# Patient Record
Sex: Male | Born: 1975 | Race: White | Hispanic: No | Marital: Single | Smoking: Never smoker
Health system: Southern US, Community
[De-identification: ages and names within clinical notes are randomized; demographics above are authoritative.]

## PROBLEM LIST (undated history)

## (undated) DIAGNOSIS — J45909 Unspecified asthma, uncomplicated: Secondary | ICD-10-CM

## (undated) DIAGNOSIS — I10 Essential (primary) hypertension: Secondary | ICD-10-CM

## (undated) DIAGNOSIS — J9383 Other pneumothorax: Secondary | ICD-10-CM

## (undated) HISTORY — PX: CARDIAC SURGERY: SHX584

## (undated) HISTORY — PX: TONSILLECTOMY: SUR1361

---

## 2013-08-18 ENCOUNTER — Emergency Department: Payer: Self-pay | Admitting: Emergency Medicine

## 2013-08-18 LAB — TROPONIN I: Troponin-I: <0.02 ng/mL

## 2013-08-18 LAB — BASIC METABOLIC PANEL
Anion Gap: 5 — ABNORMAL LOW (ref 7–16)
BUN: 10 mg/dL (ref 7–18)
Chloride: 104 mmol/L (ref 98–107)
Co2: 28 mmol/L (ref 21–32)
Creatinine: 0.87 mg/dL (ref 0.60–1.30)
EGFR (African American): >60
EGFR (Non-African Amer.): >60
Glucose: 99 mg/dL (ref 65–99)

## 2013-08-18 LAB — CBC
HCT: 45.1 % (ref 40.0–52.0)
HGB: 15.6 g/dL (ref 13.0–18.0)
MCHC: 34.7 g/dL (ref 32.0–36.0)
MCV: 79 fL — ABNORMAL LOW (ref 80–100)
Platelet: 284 10*3/uL (ref 150–440)
RBC: 5.68 10*6/uL (ref 4.40–5.90)
WBC: 11.2 10*3/uL — ABNORMAL HIGH (ref 3.8–10.6)

## 2013-08-28 ENCOUNTER — Ambulatory Visit: Payer: Self-pay | Admitting: Cardiology

## 2014-04-16 ENCOUNTER — Ambulatory Visit: Payer: Self-pay | Admitting: Family Medicine

## 2014-05-04 ENCOUNTER — Ambulatory Visit: Payer: Self-pay | Admitting: Family Medicine

## 2015-01-05 IMAGING — CT CT ANGIO CHEST
2 of 6 series · 18 of 36 positions shown · IV contrast (APPLIED)
Comparison: Plain film of the chest earlier this same day.

CLINICAL DATA: Intermittent chest pain for several weeks. Weakness
and dizziness.

EXAM:
CT ANGIOGRAPHY CHEST WITH CONTRAST
TECHNIQUE: Multidetector CT imaging of the chest was performed using the
standard protocol during bolus administration of intravenous
contrast. Multiplanar CT image reconstructions including MIPs were
obtained to evaluate the vascular anatomy.
CONTRAST:  100 cc Isovue 370.

[Series 5: pe 1.0 thins · axial · 0.78mm/px · z∈[-574,-282]mm · 17 of 330 slices shown]
[im 19/330  lung]
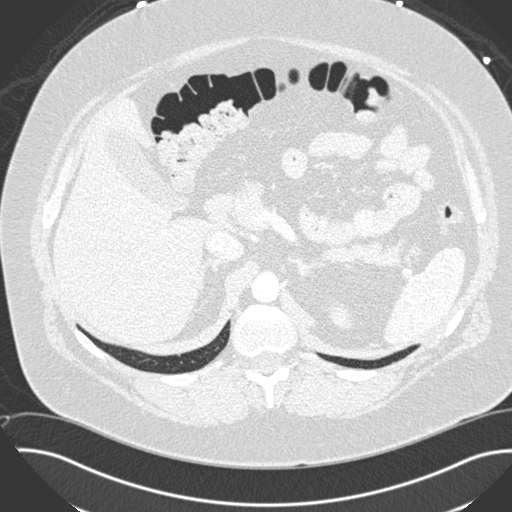
[im 37/330  mediastinal]
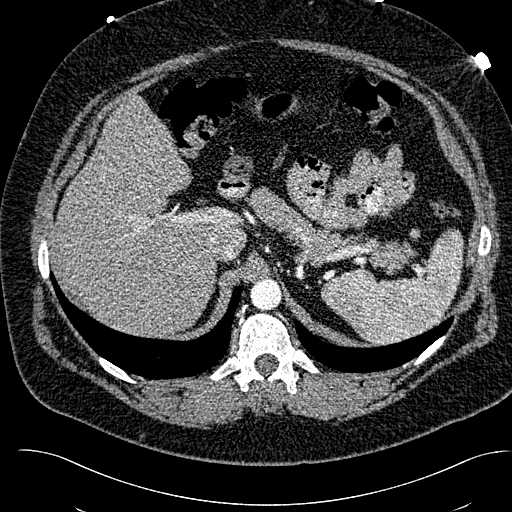
[im 55/330  lung]
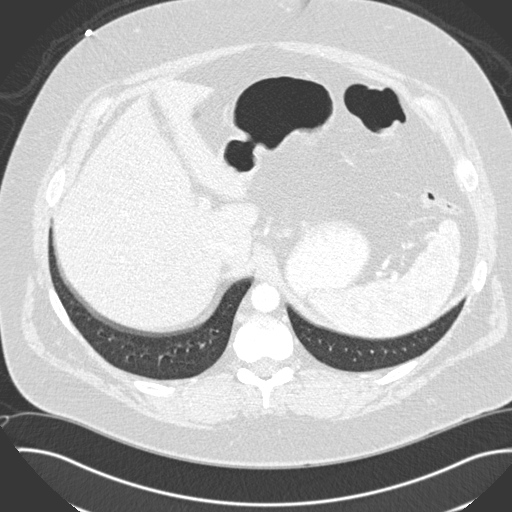
[im 74/330  mediastinal]
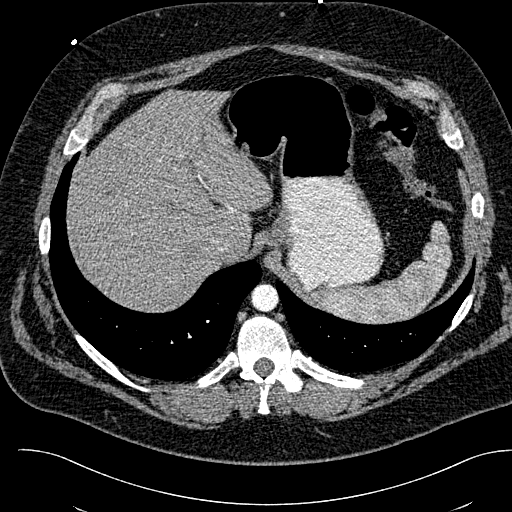
[im 92/330  lung]
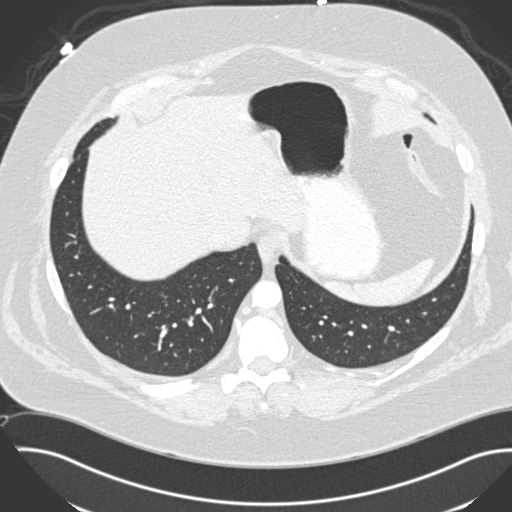
[im 110/330  mediastinal]
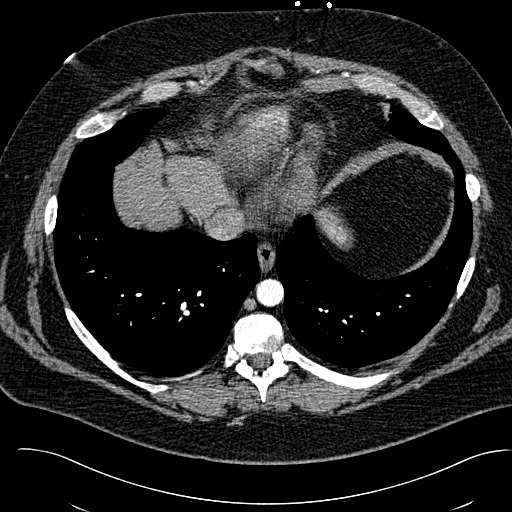
[im 128/330  lung]
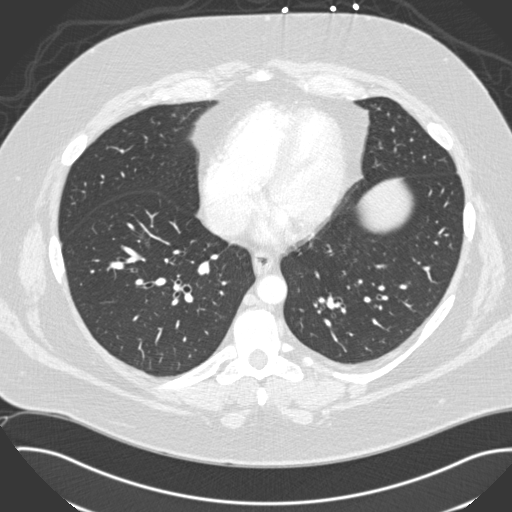
[im 147/330  mediastinal]
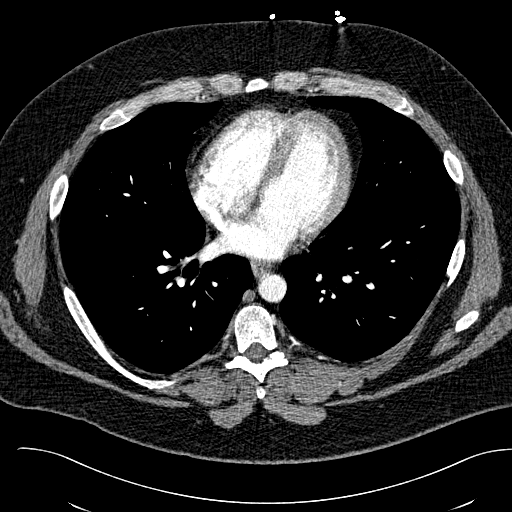
[im 165/330  lung]
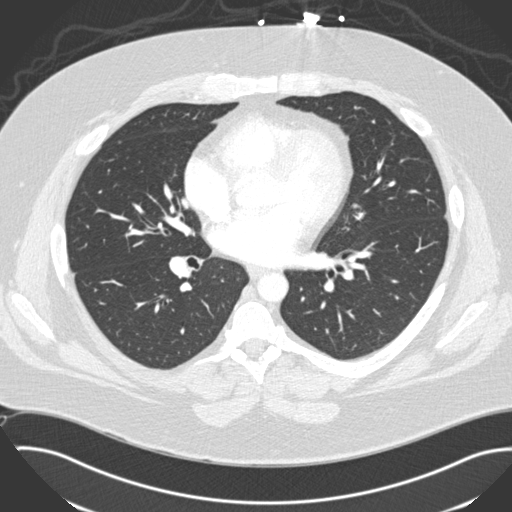
[im 183/330  mediastinal]
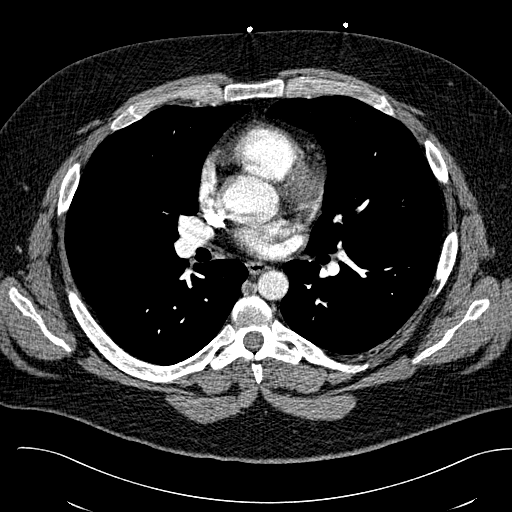
[im 202/330  lung]
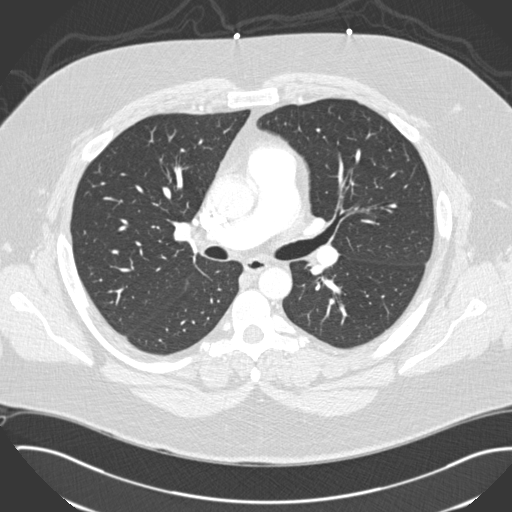
[im 220/330  mediastinal]
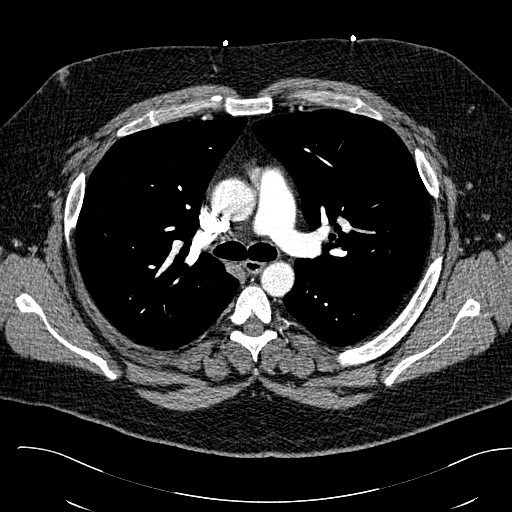
[im 238/330  lung]
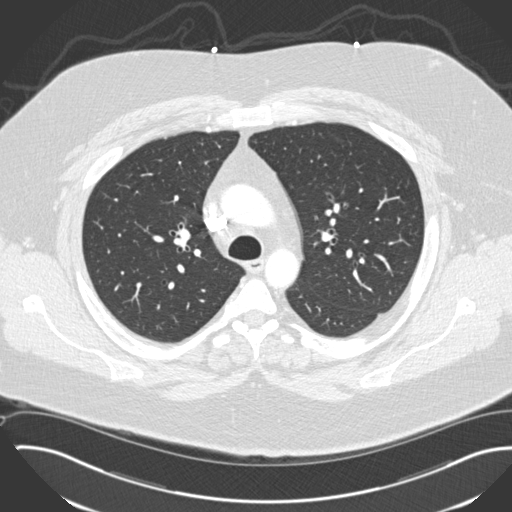
[im 256/330  mediastinal]
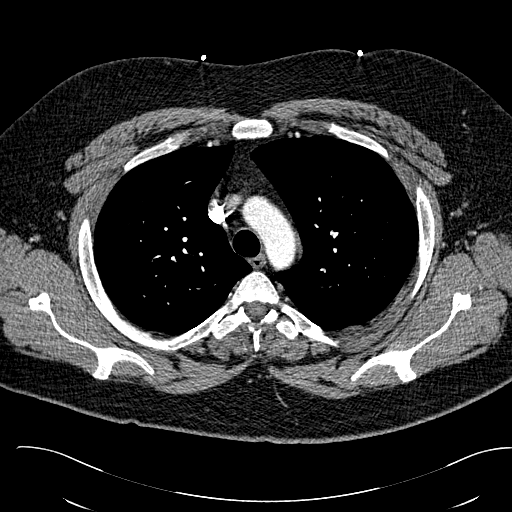
[im 275/330  lung]
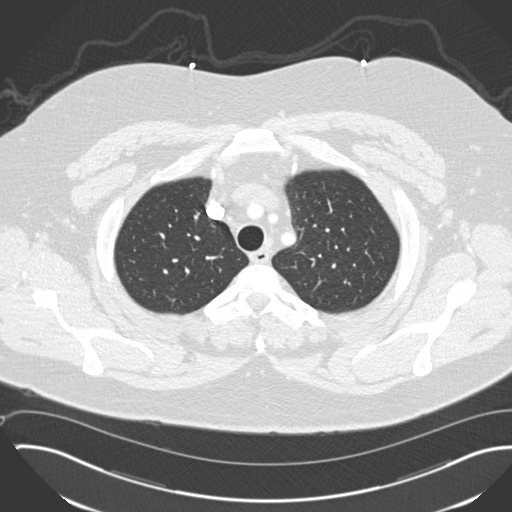
[im 293/330  mediastinal]
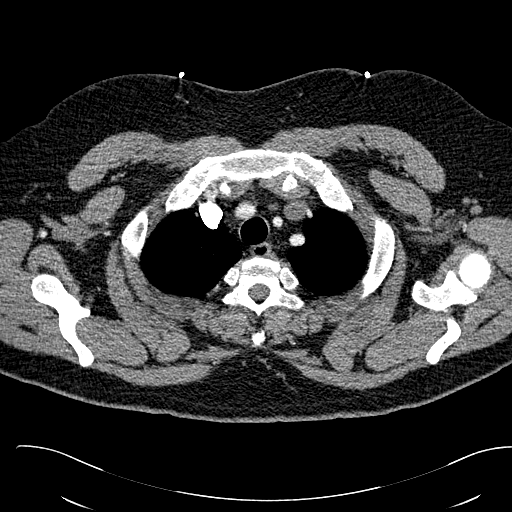
[im 311/330  lung]
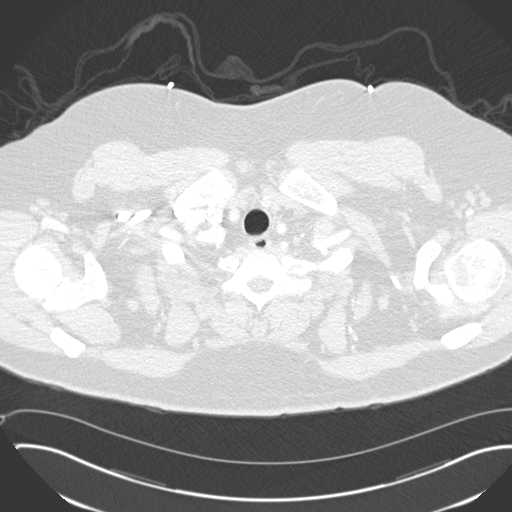

[Series 7: cor pe 2.0 mpr · coronal · 0.75mm/px · 1 of 142 slices shown]
[im 71/142  mediastinal]
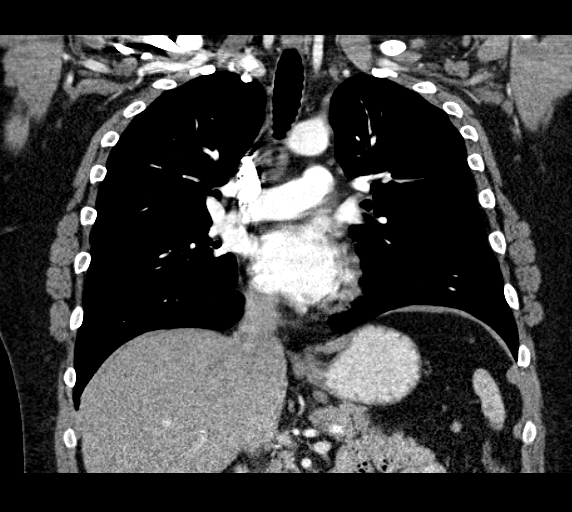

[18 of 36 positions shown; findings below may reference images not displayed]

FINDINGS: No pulmonary embolus is identified. There is no axillary, hilar or
mediastinal lymphadenopathy. No pleural or pericardial effusion.
Heart size is normal. The lungs are clear. No focal abnormality is
seen in the imaged upper abdomen. No focal bony abnormality is
identified.

Review of the MIP images confirms the above findings.
IMPRESSION: Negative for pulmonary embolus.  Negative examination

## 2015-04-12 ENCOUNTER — Ambulatory Visit
Admission: EM | Admit: 2015-04-12 | Discharge: 2015-04-12 | Disposition: A | Discharge status: Home / Self Care | Payer: Self-pay | Attending: Family Medicine | Admitting: Family Medicine

## 2015-04-12 ENCOUNTER — Ambulatory Visit: Payer: Self-pay

## 2015-04-12 DIAGNOSIS — J4541 Moderate persistent asthma with (acute) exacerbation: Secondary | ICD-10-CM | POA: Insufficient documentation

## 2015-04-12 DIAGNOSIS — Z79899 Other long term (current) drug therapy: Secondary | ICD-10-CM | POA: Insufficient documentation

## 2015-04-12 DIAGNOSIS — J209 Acute bronchitis, unspecified: Secondary | ICD-10-CM | POA: Insufficient documentation

## 2015-04-12 DIAGNOSIS — J40 Bronchitis, not specified as acute or chronic: Secondary | ICD-10-CM

## 2015-04-12 HISTORY — DX: Other pneumothorax: J93.83

## 2015-04-12 HISTORY — DX: Unspecified asthma, uncomplicated: J45.909

## 2015-04-12 HISTORY — DX: Essential (primary) hypertension: I10

## 2015-04-12 MED ORDER — IPRATROPIUM-ALBUTEROL 0.5-2.5 (3) MG/3ML IN SOLN
3.0000 mL | Freq: Once | RESPIRATORY_TRACT | Status: AC
  Administered 2015-04-12: 3 mL via RESPIRATORY_TRACT

## 2015-04-12 MED ORDER — METHYLPREDNISOLONE SODIUM SUCC 125 MG IJ SOLR
125.0000 mg | Freq: Once | INTRAMUSCULAR | Status: AC
  Administered 2015-04-12: 125 mg via INTRAMUSCULAR

## 2015-04-12 MED ORDER — HYDROCOD POLST-CPM POLST ER 10-8 MG/5ML PO SUER: 5.0000 mL | Freq: Two times a day (BID) | ORAL | Status: DC | PRN

## 2015-04-12 MED ORDER — PREDNISONE 10 MG (21) PO TBPK: ORAL_TABLET | ORAL | Status: DC

## 2015-04-12 MED ORDER — AZITHROMYCIN 250 MG PO TABS: ORAL_TABLET | ORAL | Status: DC

## 2015-04-12 NOTE — ED Provider Notes (Addendum)
CSN: [id number]     Arrival date & time 04/12/15  1626 History   First MD Initiated Contact with Patient 04/12/15 1650     Chief Complaint  Patient presents with  . Cough   (Consider location/radiation/quality/duration/timing/severity/associated sxs/prior Treatment) Patient is a 39 y.o. male presenting with cough. The history is provided by the patient. No language interpreter was used.  Cough Cough characteristics:  Productive Sputum characteristics:  Green Severity:  Moderate Duration:  2 weeks Timing:  Unable to specify Progression:  Waxing and waning Chronicity:  New Context: upper respiratory infection   Relieved by:  Beta-agonist inhaler Worsened by:  Activity Ineffective treatments:  Beta-agonist inhaler, decongestant and cough suppressants Associated symptoms: no chest pain, no chills and no fever   Risk factors: no chemical exposure, no recent infection and no recent travel     Past Medical History  Diagnosis Date  . Asthma   . Hypertension   . Spontaneous pneumothorax     Age 70   Past Surgical History  Procedure Laterality Date  . Tonsillectomy     Family History  Problem Relation Age of Onset  . Pulmonary embolism Mother   . Deep vein thrombosis Mother   . Aortic aneurysm Father    History  Substance Use Topics  . Smoking status: Never Smoker   . Smokeless tobacco: Not on file  . Alcohol Use: Yes     Comment: socially    Review of Systems  Constitutional: Negative for fever and chills.  Respiratory: Positive for cough.   Cardiovascular: Negative for chest pain.  All other systems reviewed and are negative.   Allergies  Sulfa antibiotics; Augmentin; Ceclor; and Penicillins  Home Medications   Prior to Admission medications   Medication Sig Start Date End Date Taking? Authorizing Provider  albuterol (PROVENTIL HFA;VENTOLIN HFA) 108 (90 BASE) MCG/ACT inhaler Inhale 2 puffs into the lungs every 4 (four) hours as needed for wheezing or shortness  of breath.   Yes Historical Provider, MD  metoprolol (LOPRESSOR) 100 MG tablet Take 100 mg by mouth 2 (two) times daily.   Yes Historical Provider, MD  montelukast (SINGULAIR) 10 MG tablet Take 10 mg by mouth at bedtime.   Yes Historical Provider, MD  quinapril-hydrochlorothiazide (ACCURETIC) 10-12.5 MG per tablet Take 1 tablet by mouth daily.   Yes Historical Provider, MD  azithromycin (ZITHROMAX Z-PAK) 250 MG tablet Take 2 tablets first day and then 1 po a day for 4 days 04/12/15   Hassan Rowan, MD  chlorpheniramine-HYDROcodone [facility] ER) 10-8 MG/5ML SUER Take 5 mLs by mouth every 12 (twelve) hours as needed for cough. 04/12/15   Hassan Rowan, MD  predniSONE (STERAPRED UNI-PAK 21 TAB) 10 MG (21) TBPK tablet Sig 6 tablet day 1, 5 tablets day 2, 4 tablets day 3,,3tablets day 4, 2 tablets day 5, 1 tablet day 6 take all tablets orally Patient request individual pills versus pack 04/12/15   Hassan Rowan, MD   BP 141/87 mmHg  Pulse 94  Temp(Src) 99.5 F (37.5 C) (Tympanic)  Resp 17  Ht 5\' 10"  (1.778 m)  Wt 315 lb (142.883 kg)  BMI 45.20 kg/m2  SpO2 94% Physical Exam  Constitutional: He is oriented to person, place, and time. He appears well-developed and well-nourished. No distress.  HENT:  Head: Normocephalic and atraumatic.  Eyes: Pupils are equal, round, and reactive to light.  Neck: Normal range of motion. Neck supple. No tracheal deviation present. No thyromegaly present.  Cardiovascular: Normal rate and regular  rhythm.   Pulmonary/Chest: He is in respiratory distress. He has wheezes.  Musculoskeletal: Normal range of motion.  Neurological: He is oriented to person, place, and time. No cranial nerve deficit.  Skin: Skin is warm. He is not diaphoretic.  Psychiatric: He has a normal mood and affect.  Vitals reviewed.   ED Course  Procedures (including critical care time) Labs Review Labs Reviewed - No data to display  Imaging Review Dg Chest 2 View  04/12/2015   CLINICAL  DATA:  Cough, wheezing, shortness of breath and chest tightness for 2 weeks worse at night, cough occasionally productive of green fluid. History hypertension, asthma  EXAM: CHEST  2 VIEW  COMPARISON:  08/18/2013  FINDINGS: Normal heart size, mediastinal contours, and pulmonary vascularity.  Minimal chronic central peribronchial thickening.  Lungs clear.  No pneumothorax.  Bones unremarkable.  IMPRESSION: Minimal chronic bronchitic changes without infiltrate.   Electronically Signed   By: Ulyses Southward M.D.   On: 04/12/2015 17:27  Since no signs of pneumonia is present will place on a 6 day course of prednisone w/a Z-pack . Duoneb given with injection of solumedrol.   MDM   1. Bronchitis with bronchospasm   2. Asthma, moderate persistent, with acute exacerbation        Hassan Rowan, MD 04/12/15 1191  Hassan Rowan, MD 04/14/15 1322

## 2015-04-12 NOTE — Discharge Instructions (Signed)
Acute Bronchitis Bronchitis is when the airways that extend from the windpipe into the lungs get red, puffy, and painful (inflamed). Bronchitis often causes thick spit (mucus) to develop. This leads to a cough. A cough is the most common symptom of bronchitis. In acute bronchitis, the condition usually begins suddenly and goes away over time (usually in 2 weeks). Smoking, allergies, and asthma can make bronchitis worse. Repeated episodes of bronchitis may cause more lung problems. HOME CARE  Rest.  Drink enough fluids to keep your pee (urine) clear or pale yellow (unless you need to limit fluids as told by your doctor).  Only take over-the-counter or prescription medicines as told by your doctor.  Avoid smoking and secondhand smoke. These can make bronchitis worse. If you are a smoker, think about using nicotine gum or skin patches. Quitting smoking will help your lungs heal faster.  Reduce the chance of getting bronchitis again by:  Washing your hands often.  Avoiding people with cold symptoms.  Trying not to touch your hands to your mouth, nose, or eyes.  Follow up with your doctor as told. GET HELP IF: Your symptoms do not improve after 1 week of treatment. Symptoms include:  Cough.  Fever.  Coughing up thick spit.  Body aches.  Chest congestion.  Chills.  Shortness of breath.  Sore throat. GET HELP RIGHT AWAY IF:   You have an increased fever.  You have chills.  You have severe shortness of breath.  You have bloody thick spit (sputum).  You throw up (vomit) often.  You lose too much body fluid (dehydration).  You have a severe headache.  You faint. MAKE SURE YOU:   Understand these instructions.  Will watch your condition.  Will get help right away if you are not doing well or get worse. Document Released: 02/13/2008 Document Revised: 04/29/2013 Document Reviewed: 02/17/2013 [facility] Patient Information 2015 [place], [place]. This information is not  intended to replace advice given to you by your health care provider. Make sure you discuss any questions you have with your health care provider.  Asthma Attack Prevention Although there is no way to prevent asthma from starting, you can take steps to control the disease and reduce its symptoms. Learn about your asthma and how to control it. Take an active role to control your asthma by working with your health care provider to create and follow an asthma action plan. An asthma action plan guides you in:  Taking your medicines properly.  Avoiding things that set off your asthma or make your asthma worse (asthma triggers).  Tracking your level of asthma control.  Responding to worsening asthma.  Seeking emergency care when needed. To track your asthma, keep records of your symptoms, check your peak flow number using a handheld device that shows how well air moves out of your lungs (peak flow meter), and get regular asthma checkups.  WHAT ARE SOME WAYS TO PREVENT AN ASTHMA ATTACK?  Take medicines as directed by your health care provider.  Keep track of your asthma symptoms and level of control.  With your health care provider, write a detailed plan for taking medicines and managing an asthma attack. Then be sure to follow your action plan. Asthma is an ongoing condition that needs regular monitoring and treatment.  Identify and avoid asthma triggers. Many outdoor allergens and irritants (such as pollen, mold, cold air, and air pollution) can trigger asthma attacks. Find out what your asthma triggers are and take steps to avoid them.  Monitor your breathing. Learn to recognize warning signs of an attack, such as coughing, wheezing, or shortness of breath. Your lung function may decrease before you notice any signs or symptoms, so regularly measure and record your peak airflow with a home peak flow meter.  Identify and treat attacks early. If you act quickly, you are less likely to have a  severe attack. You will also need less medicine to control your symptoms. When your peak flow measurements decrease and alert you to an upcoming attack, take your medicine as instructed and immediately stop any activity that may have triggered the attack. If your symptoms do not improve, get medical help.  Pay attention to increasing quick-relief inhaler use. If you find yourself relying on your quick-relief inhaler, your asthma is not under control. See your health care provider about adjusting your treatment. WHAT CAN MAKE MY SYMPTOMS WORSE? A number of common things can set off or make your asthma symptoms worse and cause temporary increased inflammation of your airways. Keep track of your asthma symptoms for several weeks, detailing all the environmental and emotional factors that are linked with your asthma. When you have an asthma attack, go back to your asthma diary to see which factor, or combination of factors, might have contributed to it. Once you know what these factors are, you can take steps to control many of them. If you have allergies and asthma, it is important to take asthma prevention steps at home. Minimizing contact with the substance to which you are allergic will help prevent an asthma attack. Some triggers and ways to avoid these triggers are: Animal Dander:  Some people are allergic to the flakes of skin or dried saliva from animals with fur or feathers.   There is no such thing as a hypoallergenic dog or cat breed. All dogs or cats can cause allergies, even if they don't shed.  Keep these pets out of your home.  If you are not able to keep a pet outdoors, keep the pet out of your bedroom and other sleeping areas at all times, and keep the door closed.  Remove carpets and furniture covered with cloth from your home. If that is not possible, keep the pet away from fabric-covered furniture and carpets. Dust Mites: Many people with asthma are allergic to dust mites. Dust mites  are tiny bugs that are found in every home in mattresses, pillows, carpets, fabric-covered furniture, bedcovers, clothes, stuffed toys, and other fabric-covered items.   Cover your mattress in a special dust-proof cover.  Cover your pillow in a special dust-proof cover, or wash the pillow each week in hot water. Water must be hotter than 130 F (54.4 C) to kill dust mites. Cold or warm water used with detergent and bleach can also be effective.  Wash the sheets and blankets on your bed each week in hot water.  Try not to sleep or lie on cloth-covered cushions.  Call ahead when traveling and ask for a smoke-free hotel room. Bring your own bedding and pillows in case the hotel only supplies feather pillows and down comforters, which may contain dust mites and cause asthma symptoms.  Remove carpets from your bedroom and those laid on concrete, if you can.  Keep stuffed toys out of the bed, or wash the toys weekly in hot water or cooler water with detergent and bleach. Cockroaches: Many people with asthma are allergic to the droppings and remains of cockroaches.   Keep food and garbage in closed containers.  Never leave food out.  Use poison baits, traps, powders, gels, or paste (for example, boric acid).  If a spray is used to kill cockroaches, stay out of the room until the odor goes away. Indoor Mold:  Fix leaky faucets, pipes, or other sources of water that have mold around them.  Clean floors and moldy surfaces with a fungicide or diluted bleach.  Avoid using humidifiers, vaporizers, or swamp coolers. These can spread molds through the air. Pollen and Outdoor Mold:  When pollen or mold spore counts are high, try to keep your windows closed.  Stay indoors with windows closed from late morning to afternoon. Pollen and some mold spore counts are highest at that time.  Ask your health care provider whether you need to take anti-inflammatory medicine or increase your dose of the  medicine before your allergy season starts. Other Irritants to Avoid:  Tobacco smoke is an irritant. If you smoke, ask your health care provider how you can quit. Ask family members to quit smoking, too. Do not allow smoking in your home or car.  If possible, do not use a wood-burning stove, kerosene heater, or fireplace. Minimize exposure to all sources of smoke, including incense, candles, fires, and fireworks.  Try to stay away from strong odors and sprays, such as perfume, talcum powder, hair spray, and paints.  Decrease humidity in your home and use an indoor air cleaning device. Reduce indoor humidity to below 60%. Dehumidifiers or central air conditioners can do this.  Decrease house dust exposure by changing furnace and air cooler filters frequently.  Try to have someone else vacuum for you once or twice a week. Stay out of rooms while they are being vacuumed and for a short while afterward.  If you vacuum, use a dust mask from a hardware store, a double-layered or microfilter vacuum cleaner bag, or a vacuum cleaner with a HEPA filter.  Sulfites in foods and beverages can be irritants. Do not drink beer or wine or eat dried fruit, processed potatoes, or shrimp if they cause asthma symptoms.  Cold air can trigger an asthma attack. Cover your nose and mouth with a scarf on cold or windy days.  Several health conditions can make asthma more difficult to manage, including a runny nose, sinus infections, reflux disease, psychological stress, and sleep apnea. Work with your health care provider to manage these conditions.  Avoid close contact with people who have a respiratory infection such as a cold or the flu, since your asthma symptoms may get worse if you catch the infection. Wash your hands thoroughly after touching items that may have been handled by people with a respiratory infection.  Get a flu shot every year to protect against the flu virus, which often makes asthma worse for  days or weeks. Also get a pneumonia shot if you have not previously had one. Unlike the flu shot, the pneumonia shot does not need to be given yearly. Medicines:  Talk to your health care provider about whether it is safe for you to take aspirin or non-steroidal anti-inflammatory medicines (NSAIDs). In a small number of people with asthma, aspirin and NSAIDs can cause asthma attacks. These medicines must be avoided by people who have known aspirin-sensitive asthma. It is important that people with aspirin-sensitive asthma read labels of all over-the-counter medicines used to treat pain, colds, coughs, and fever.  Beta-blockers and ACE inhibitors are other medicines you should discuss with your health care provider. HOW CAN I FIND OUT WHAT  I AM ALLERGIC TO? Ask your asthma health care provider about allergy skin testing or blood testing (the RAST test) to identify the allergens to which you are sensitive. If you are found to have allergies, the most important thing to do is to try to avoid exposure to any allergens that you are sensitive to as much as possible. Other treatments for allergies, such as medicines and allergy shots (immunotherapy) are available.  CAN I EXERCISE? Follow your health care provider's advice regarding asthma treatment before exercising. It is important to maintain a regular exercise program, but vigorous exercise or exercise in cold, humid, or dry environments can cause asthma attacks, especially for those people who have exercise-induced asthma. Document Released: 08/15/2009 Document Revised: 09/01/2013 Document Reviewed: 03/04/2013 [facility] Patient Information 2015 [place], [place]. This information is not intended to replace advice given to you by your health care provider. Make sure you discuss any questions you have with your health care provider.

## 2015-04-12 NOTE — ED Notes (Signed)
Started 2 weeks ago with cough and wheezing. Worse at night. Using Albuteral puffer "more than I should".

## 2015-05-20 ENCOUNTER — Encounter: Payer: Self-pay | Admitting: Emergency Medicine

## 2015-05-20 ENCOUNTER — Ambulatory Visit
Admission: EM | Admit: 2015-05-20 | Discharge: 2015-05-20 | Disposition: A | Discharge status: Home / Self Care | Payer: Self-pay | Attending: Family Medicine | Admitting: Family Medicine

## 2015-05-20 DIAGNOSIS — J45901 Unspecified asthma with (acute) exacerbation: Secondary | ICD-10-CM

## 2015-05-20 MED ORDER — IPRATROPIUM-ALBUTEROL 0.5-2.5 (3) MG/3ML IN SOLN
3.0000 mL | Freq: Once | RESPIRATORY_TRACT | Status: AC
  Administered 2015-05-20: 3 mL via RESPIRATORY_TRACT

## 2015-05-20 MED ORDER — BENZONATATE 100 MG PO CAPS: 100.0000 mg | ORAL_CAPSULE | Freq: Three times a day (TID) | ORAL | Status: DC | PRN

## 2015-05-20 MED ORDER — PREDNISONE 10 MG (21) PO TBPK: 10.0000 mg | ORAL_TABLET | Freq: Every day | ORAL | Status: DC

## 2015-05-20 MED ORDER — DOXYCYCLINE HYCLATE 100 MG PO CAPS: 100.0000 mg | ORAL_CAPSULE | Freq: Two times a day (BID) | ORAL | Status: DC

## 2015-05-20 MED ORDER — FLUTICASONE PROPIONATE 50 MCG/ACT NA SUSP
2.0000 | Freq: Every day | NASAL | Status: DC
Start: 2015-05-20 — End: 2019-09-27

## 2015-05-20 NOTE — ED Notes (Signed)
Patient c/o cough and chest congestion for a week.  Patient denies fevers.  Patient denies any other cold symptoms.

## 2015-05-20 NOTE — Discharge Instructions (Signed)
Contact PCP for follow up asthma- re-establish if those connections have been broken Return to us/ER of choice any acute epsisode Medications as reviewed Add Flonase for seasonal allergies  Weight !!!!!!!!! Healthy food choices and portion control ! Fast Foods are  NOT your friends !!   Asthma, Acute Bronchospasm Acute bronchospasm caused by asthma is also referred to as an asthma attack. Bronchospasm means your air passages become narrowed. The narrowing is caused by inflammation and tightening of the muscles in the air tubes (bronchi) in your lungs. This can make it hard to breathe or cause you to wheeze and cough. CAUSES Possible triggers are:  Animal dander from the skin, hair, or feathers of animals.  Dust mites contained in house dust.  Cockroaches.  Pollen from trees or grass.  Mold.  Cigarette or tobacco smoke.  Air pollutants such as dust, household cleaners, hair sprays, aerosol sprays, paint fumes, strong chemicals, or strong odors.  Cold air or weather changes. Cold air may trigger inflammation. Winds increase molds and pollens in the air.  Strong emotions such as crying or laughing hard.  Stress.  Certain medicines such as aspirin or beta-blockers.  Sulfites in foods and drinks, such as dried fruits and wine.  Infections or inflammatory conditions, such as a flu, cold, or inflammation of the nasal membranes (rhinitis).  Gastroesophageal reflux disease (GERD). GERD is a condition where stomach acid backs up into your esophagus.  Exercise or strenuous activity. SIGNS AND SYMPTOMS   Wheezing.  Excessive coughing, particularly at night.  Chest tightness.  Shortness of breath. DIAGNOSIS  Your health care provider will ask you about your medical history and perform a physical exam. A chest X-ray or blood testing may be performed to look for other causes of your symptoms or other conditions that may have triggered your asthma attack. TREATMENT  Treatment  is aimed at reducing inflammation and opening up the airways in your lungs. Most asthma attacks are treated with inhaled medicines. These include quick relief or rescue medicines (such as bronchodilators) and controller medicines (such as inhaled corticosteroids). These medicines are sometimes given through an inhaler or a nebulizer. Systemic steroid medicine taken by mouth or given through an IV tube also can be used to reduce the inflammation when an attack is moderate or severe. Antibiotic medicines are only used if a bacterial infection is present.  HOME CARE INSTRUCTIONS   Rest.  Drink plenty of liquids. This helps the mucus to remain thin and be easily coughed up. Only use caffeine in moderation and do not use alcohol until you have recovered from your illness.  Do not smoke. Avoid being exposed to secondhand smoke.  You play a critical role in keeping yourself in good health. Avoid exposure to things that cause you to wheeze or to have breathing problems.  Keep your medicines up-to-date and available. Carefully follow your health care provider's treatment plan.  Take your medicine exactly as prescribed.  When pollen or pollution is bad, keep windows closed and use an air conditioner or go to places with air conditioning.  Asthma requires careful medical care. See your health care provider for a follow-up as advised. If you are more than [redacted] weeks pregnant and you were prescribed any new medicines, let your obstetrician know about the visit and how you are doing. Follow up with your health care provider as directed.  After you have recovered from your asthma attack, make an appointment with your outpatient doctor to talk about ways to reduce  the likelihood of future attacks. If you do not have a doctor who manages your asthma, make an appointment with a primary care doctor to discuss your asthma. SEEK IMMEDIATE MEDICAL CARE IF:   You are getting worse.  You have trouble breathing. If  severe, call your local emergency services (911 in the [place]  You develop chest pain or discomfort.  You are vomiting.  You are not able to keep fluids down.  You are coughing up yellow, green, brown, or bloody sputum.  You have a fever and your symptoms suddenly get worse.  You have trouble swallowing. MAKE SURE YOU:   Understand these instructions.  Will watch your condition.  Will get help right away if you are not doing well or get worse. Document Released: 12/12/2006 Document Revised: 09/01/2013 Document Reviewed: 03/04/2013 [facility] Patient Information 2015 [place], [place]. This information is not intended to replace advice given to you by your health care provider. Make sure you discuss any questions you have with your health care provider.  Asthma Attack Prevention Although there is no way to prevent asthma from starting, you can take steps to control the disease and reduce its symptoms. Learn about your asthma and how to control it. Take an active role to control your asthma by working with your health care provider to create and follow an asthma action plan. An asthma action plan guides you in:  Taking your medicines properly.  Avoiding things that set off your asthma or make your asthma worse (asthma triggers).  Tracking your level of asthma control.  Responding to worsening asthma.  Seeking emergency care when needed. To track your asthma, keep records of your symptoms, check your peak flow number using a handheld device that shows how well air moves out of your lungs (peak flow meter), and get regular asthma checkups.  WHAT ARE SOME WAYS TO PREVENT AN ASTHMA ATTACK?  Take medicines as directed by your health care provider.  Keep track of your asthma symptoms and level of control.  With your health care provider, write a detailed plan for taking medicines and managing an asthma attack. Then be sure to follow your action plan. Asthma is an ongoing condition that  needs regular monitoring and treatment.  Identify and avoid asthma triggers. Many outdoor allergens and irritants (such as pollen, mold, cold air, and air pollution) can trigger asthma attacks. Find out what your asthma triggers are and take steps to avoid them.  Monitor your breathing. Learn to recognize warning signs of an attack, such as coughing, wheezing, or shortness of breath. Your lung function may decrease before you notice any signs or symptoms, so regularly measure and record your peak airflow with a home peak flow meter.  Identify and treat attacks early. If you act quickly, you are less likely to have a severe attack. You will also need less medicine to control your symptoms. When your peak flow measurements decrease and alert you to an upcoming attack, take your medicine as instructed and immediately stop any activity that may have triggered the attack. If your symptoms do not improve, get medical help.  Pay attention to increasing quick-relief inhaler use. If you find yourself relying on your quick-relief inhaler, your asthma is not under control. See your health care provider about adjusting your treatment. WHAT CAN MAKE MY SYMPTOMS WORSE? A number of common things can set off or make your asthma symptoms worse and cause temporary increased inflammation of your airways. Keep track of your asthma symptoms for  several weeks, detailing all the environmental and emotional factors that are linked with your asthma. When you have an asthma attack, go back to your asthma diary to see which factor, or combination of factors, might have contributed to it. Once you know what these factors are, you can take steps to control many of them. If you have allergies and asthma, it is important to take asthma prevention steps at home. Minimizing contact with the substance to which you are allergic will help prevent an asthma attack. Some triggers and ways to avoid these triggers are: Animal Dander:  Some  people are allergic to the flakes of skin or dried saliva from animals with fur or feathers.   There is no such thing as a hypoallergenic dog or cat breed. All dogs or cats can cause allergies, even if they don't shed.  Keep these pets out of your home.  If you are not able to keep a pet outdoors, keep the pet out of your bedroom and other sleeping areas at all times, and keep the door closed.  Remove carpets and furniture covered with cloth from your home. If that is not possible, keep the pet away from fabric-covered furniture and carpets. Dust Mites: Many people with asthma are allergic to dust mites. Dust mites are tiny bugs that are found in every home in mattresses, pillows, carpets, fabric-covered furniture, bedcovers, clothes, stuffed toys, and other fabric-covered items.   Cover your mattress in a special dust-proof cover.  Cover your pillow in a special dust-proof cover, or wash the pillow each week in hot water. Water must be hotter than 130 F (54.4 C) to kill dust mites. Cold or warm water used with detergent and bleach can also be effective.  Wash the sheets and blankets on your bed each week in hot water.  Try not to sleep or lie on cloth-covered cushions.  Call ahead when traveling and ask for a smoke-free hotel room. Bring your own bedding and pillows in case the hotel only supplies feather pillows and down comforters, which may contain dust mites and cause asthma symptoms.  Remove carpets from your bedroom and those laid on concrete, if you can.  Keep stuffed toys out of the bed, or wash the toys weekly in hot water or cooler water with detergent and bleach. Cockroaches: Many people with asthma are allergic to the droppings and remains of cockroaches.   Keep food and garbage in closed containers. Never leave food out.  Use poison baits, traps, powders, gels, or paste (for example, boric acid).  If a spray is used to kill cockroaches, stay out of the room until the  odor goes away. Indoor Mold:  Fix leaky faucets, pipes, or other sources of water that have mold around them.  Clean floors and moldy surfaces with a fungicide or diluted bleach.  Avoid using humidifiers, vaporizers, or swamp coolers. These can spread molds through the air. Pollen and Outdoor Mold:  When pollen or mold spore counts are high, try to keep your windows closed.  Stay indoors with windows closed from late morning to afternoon. Pollen and some mold spore counts are highest at that time.  Ask your health care provider whether you need to take anti-inflammatory medicine or increase your dose of the medicine before your allergy season starts. Other Irritants to Avoid:  Tobacco smoke is an irritant. If you smoke, ask your health care provider how you can quit. Ask family members to quit smoking, too. Do not allow smoking  in your home or car.  If possible, do not use a wood-burning stove, kerosene heater, or fireplace. Minimize exposure to all sources of smoke, including incense, candles, fires, and fireworks.  Try to stay away from strong odors and sprays, such as perfume, talcum powder, hair spray, and paints.  Decrease humidity in your home and use an indoor air cleaning device. Reduce indoor humidity to below 60%. Dehumidifiers or central air conditioners can do this.  Decrease house dust exposure by changing furnace and air cooler filters frequently.  Try to have someone else vacuum for you once or twice a week. Stay out of rooms while they are being vacuumed and for a short while afterward.  If you vacuum, use a dust mask from a hardware store, a double-layered or microfilter vacuum cleaner bag, or a vacuum cleaner with a HEPA filter.  Sulfites in foods and beverages can be irritants. Do not drink beer or wine or eat dried fruit, processed potatoes, or shrimp if they cause asthma symptoms.  Cold air can trigger an asthma attack. Cover your nose and mouth with a scarf on  cold or windy days.  Several health conditions can make asthma more difficult to manage, including a runny nose, sinus infections, reflux disease, psychological stress, and sleep apnea. Work with your health care provider to manage these conditions.  Avoid close contact with people who have a respiratory infection such as a cold or the flu, since your asthma symptoms may get worse if you catch the infection. Wash your hands thoroughly after touching items that may have been handled by people with a respiratory infection.  Get a flu shot every year to protect against the flu virus, which often makes asthma worse for days or weeks. Also get a pneumonia shot if you have not previously had one. Unlike the flu shot, the pneumonia shot does not need to be given yearly. Medicines:  Talk to your health care provider about whether it is safe for you to take aspirin or non-steroidal anti-inflammatory medicines (NSAIDs). In a small number of people with asthma, aspirin and NSAIDs can cause asthma attacks. These medicines must be avoided by people who have known aspirin-sensitive asthma. It is important that people with aspirin-sensitive asthma read labels of all over-the-counter medicines used to treat pain, colds, coughs, and fever.  Beta-blockers and ACE inhibitors are other medicines you should discuss with your health care provider. HOW CAN I FIND OUT WHAT I AM ALLERGIC TO? Ask your asthma health care provider about allergy skin testing or blood testing (the RAST test) to identify the allergens to which you are sensitive. If you are found to have allergies, the most important thing to do is to try to avoid exposure to any allergens that you are sensitive to as much as possible. Other treatments for allergies, such as medicines and allergy shots (immunotherapy) are available.  CAN I EXERCISE? Follow your health care provider's advice regarding asthma treatment before exercising. It is important to maintain a  regular exercise program, but vigorous exercise or exercise in cold, humid, or dry environments can cause asthma attacks, especially for those people who have exercise-induced asthma. Document Released: 08/15/2009 Document Revised: 09/01/2013 Document Reviewed: 03/04/2013 [facility] Patient Information 2015 [place], [place]. This information is not intended to replace advice given to you by your health care provider. Make sure you discuss any questions you have with your health care provider.

## 2015-05-20 NOTE — ED Provider Notes (Signed)
CSN: [id number]     Arrival date & time 05/20/15  0751 History   First MD Initiated Contact with Patient 05/20/15 [id number]     Chief Complaint  Patient presents with  . Cough   (Consider location/radiation/quality/duration/timing/severity/associated sxs/prior Treatment) HPI  39 yo M with longstanding asthma having more trouble keeping it controlled. Now using his albuterol inhaler almost hourly on bad days. Takes Singulair 10 mg daily and Zyrtec daily. Has Flonase but doesn't use it. Experiencing wheezing on a daily basis.  Closer questioning reveals his weight has now increased to 361 and he probably hasn't seen who he considers his PCP Dr Daniel Nones in almost 5 years. Has HTN treated with Accuretic and Metoprolol. Was seen here as recently as 04/12/15 with Chronic Bronchitis and treated with Prednisone taper and Zpak. CXR at that time supports chronic bronchitis. Right lower rib cage now with tenderness between ribs suspected from cough  Past Medical History  Diagnosis Date  . Asthma   . Hypertension   . Spontaneous pneumothorax     Age 46   Past Surgical History  Procedure Laterality Date  . Tonsillectomy    . Cardiac surgery     Family History  Problem Relation Age of Onset  . Pulmonary embolism Mother   . Deep vein thrombosis Mother   . Aortic aneurysm Father    Social History  Substance Use Topics  . Smoking status: Never Smoker   . Smokeless tobacco: None  . Alcohol Use: Yes     Comment: socially    Review of Systems  Constitutional -afebrile Eyes-denies visual changes ENT- normal voice,denies sore throat CV-denies chest pain, Resp- respirations increased, wheezing, difficult to sleep at night.; occ sputum AM GI- negative for nausea,vomiting, diarrhea GU- negative for dysuria MSK- negative for back pain, ambulatory Skin- denies acute changes Neuro- negative headache,focal weakness or numbness    Allergies  Sulfa antibiotics; Augmentin; Ceclor; and  Penicillins  Home Medications   Prior to Admission medications   Medication Sig Start Date End Date Taking? Authorizing Provider  albuterol (PROVENTIL HFA;VENTOLIN HFA) 108 (90 BASE) MCG/ACT inhaler Inhale 2 puffs into the lungs every 4 (four) hours as needed for wheezing or shortness of breath.   Yes Historical Provider, MD  cetirizine (ZYRTEC) 10 MG tablet Take 10 mg by mouth at bedtime.   Yes Historical Provider, MD  chlorpheniramine-HYDROcodone (TUSSIONEX PENNKINETIC ER) 10-8 MG/5ML SUER Take 5 mLs by mouth every 12 (twelve) hours as needed for cough. 04/12/15  Yes Hassan Rowan, MD  azithromycin (ZITHROMAX Z-PAK) 250 MG tablet Take 2 tablets first day and then 1 po a day for 4 days 04/12/15   Hassan Rowan, MD  benzonatate (TESSALON) 100 MG capsule Take 1 capsule (100 mg total) by mouth 3 (three) times daily as needed. 05/20/15   Rae Halsted, PA-C  doxycycline (VIBRAMYCIN) 100 MG capsule Take 1 capsule (100 mg total) by mouth 2 (two) times daily. 05/20/15   Rae Halsted, PA-C  fluticasone [facility]) 50 MCG/ACT nasal spray Place 2 sprays into both nostrils daily. 05/20/15   Rae Halsted, PA-C  metoprolol (LOPRESSOR) 100 MG tablet Take 100 mg by mouth 2 (two) times daily.    Historical Provider, MD  montelukast (SINGULAIR) 10 MG tablet Take 10 mg by mouth at bedtime.    Historical Provider, MD  predniSONE (STERAPRED UNI-PAK 21 TAB) 10 MG (21) TBPK tablet Take 1 tablet (10 mg total) by mouth daily. 6-5-4-3-2-1- tablets  Days 1-2-3-4-5 as previously 05/20/15  Rae Halsted, PA-C  quinapril-hydrochlorothiazide (ACCURETIC) 10-12.5 MG per tablet Take 1 tablet by mouth daily.    Historical Provider, MD   Meds Ordered and Administered this Visit   Medications  ipratropium-albuterol (DUONEB) 0.5-2.5 (3) MG/3ML nebulizer solution 3 mL (3 mLs Nebulization Given 05/20/15 0825)  ipratropium-albuterol (DUONEB) 0.5-2.5 (3) MG/3ML nebulizer solution 3 mL (3 mLs Nebulization Given 05/20/15 0858)  Sp02 presented and remained  at 96% but patient  felt much improved with additional Rx  BP 146/77 mmHg  Pulse 92  Temp(Src) 99.3 F (37.4 C) (Tympanic)  Resp 17  Ht 5\' 11"  (1.803 m)  Wt 311 lb (141.069 kg)  BMI 43.40 kg/m2  SpO2 96% No data found.   Physical Exam   Constitutional -alert and oriented,grossly obese, very pleasant adult M Head-atraumatic, normocephalic Eyes- conjunctiva normal, EOMI ,conjugate gaze Nose- no congestion or rhinorrhea Mouth/throat- mucous membranes moist ,oropharynx non-erythematous Neck- supple without glandular enlargement, bull necked CV- regular rate, grossly normal heart sounds,  BP 146/77 Resp-wheezing bilateral everywhere,  and intermittent cough, non productive, resp 17 GI- soft,non-tender,no distention GU-  not examined MSK- no tender, normal ROM, all extremities, ambulatory, self-care; no pedal edema Neuro- normal speech and language, no gross focal neurological deficit appreciated, no gait instability, Skin-warm,dry ,intact; no rash noted Psych-mood and affect grossly  ED Course  Procedures (including critical care time)  Labs Review Labs Reviewed - No data to display  Imaging Review No results found.  Have discussed asthma out of control - and weight equally so.  He loves fast food and commonly consumes cheese, bacon and mayo on multiple burgers, plus fries and a shake Parents and brother have been "on his case" to seek medical evaluation and get a handle on his diet.  Will add back Flonase and use BID for a few days; increase Zyrtec to BID also for 3-5 days.  Add Doxycycline and a short prednisone taper. To go directly to PCP office and determine his current status -if considered active he is to set up ASAP appointment.       MDM   1. Asthma exacerbation    Diagnosis and treatment discussed. . Questions fielded, expectations and recommendations reviewed.  Patient expresses understanding. Will return to [facility] with questions, concern or exacerbation.     Discharge Medication List as of 05/20/2015  9:26 AM    START taking these medications   Details  benzonatate (TESSALON) 100 MG capsule Take 1 capsule (100 mg total) by mouth 3 (three) times daily as needed., Starting 05/20/2015, Until Discontinued, Print    doxycycline (VIBRAMYCIN) 100 MG capsule Take 1 capsule (100 mg total) by mouth 2 (two) times daily., Starting 05/20/2015, Until Discontinued, Print    fluticasone (FLONASE) 50 MCG/ACT nasal spray Place 2 sprays into both nostrils daily., Starting 05/20/2015, Until Discontinued, Print      Sreipred Uni-Pak 21 tabls 10 mg  Rae Halsted, [place] 05/20/15 1512

## 2016-10-01 ENCOUNTER — Ambulatory Visit
Admission: EM | Admit: 2016-10-01 | Discharge: 2016-10-01 | Disposition: A | Discharge status: Home / Self Care | Payer: Self-pay | Attending: Family Medicine | Admitting: Family Medicine

## 2016-10-01 ENCOUNTER — Encounter: Payer: Self-pay | Admitting: *Deleted

## 2016-10-01 DIAGNOSIS — H109 Unspecified conjunctivitis: Secondary | ICD-10-CM

## 2016-10-01 DIAGNOSIS — J111 Influenza due to unidentified influenza virus with other respiratory manifestations: Secondary | ICD-10-CM

## 2016-10-01 DIAGNOSIS — R69 Illness, unspecified: Secondary | ICD-10-CM

## 2016-10-01 LAB — RAPID STREP SCREEN (MED CTR MEBANE ONLY): STREPTOCOCCUS, GROUP A SCREEN (DIRECT): NEGATIVE

## 2016-10-01 LAB — RAPID INFLUENZA A&B ANTIGENS
Influenza A (ARMC): NEGATIVE
Influenza B (ARMC): NEGATIVE

## 2016-10-01 MED ORDER — OSELTAMIVIR PHOSPHATE 75 MG PO CAPS: 75.0000 mg | ORAL_CAPSULE | Freq: Two times a day (BID) | ORAL | 0 refills | Status: DC

## 2016-10-01 MED ORDER — ERYTHROMYCIN 5 MG/GM OP OINT: 1.0000 "application " | TOPICAL_OINTMENT | Freq: Four times a day (QID) | OPHTHALMIC | 0 refills | Status: DC

## 2016-10-01 MED ORDER — ALBUTEROL SULFATE HFA 108 (90 BASE) MCG/ACT IN AERS: 2.0000 | INHALATION_SPRAY | RESPIRATORY_TRACT | 0 refills | Status: DC | PRN

## 2016-10-01 NOTE — Discharge Instructions (Signed)
Take medication as prescribed. Rest. Drink plenty of fluids.  ° °Follow up with your primary care physician this week as needed. Return to Urgent care for new or worsening concerns.  ° °

## 2016-10-01 NOTE — ED Triage Notes (Addendum)
Last Friday onset of productive cough- green, sore throat, fever up to 102, chills, body aches, which partially resolved over weekend. This morning awoke with both eyes matted closed. Eyes red and left eye slightly edematous. Pt's father recently died with Luis Wallace -Fisher syndrome and Guilliand-Barre', which began with conjunctivitis. Pt concerned his condition may be beginning of similar problem.

## 2016-10-01 NOTE — ED Provider Notes (Signed)
[facility] ____________________________________________  Time seen: Approximately 10:16 AM  I have reviewed the triage vital signs and the nursing notes.   HISTORY  Chief Complaint Eye Drainage; Conjunctivitis; Cough; Sore Throat; and Fever   HPI Luis Wallace is a 41 y.o. male presenting for the complaints of on this past Friday evening of cough, congestion, sore throat, chills, body aches and fever. Reports fever maximum was 102 orally Friday night into Saturday morning. Reports no fever since Saturday but still some chills and body aches. Reports sore throat is much improved and now with a mild scratchy areas reports continues with intermittent dry cough and runny nose. Patient reports this morning he woke up with both eyes red with some greenish drainage, left worse than right. Denies vision changes, eye pain or difficulty moving eyes. Patient reports symptoms unresolved with over-the-counter cough and congestion medications. Reports continues to eat and drink well.  Denies chest pain, shortness of breath, abdominal pain, dysuria, weakness, paresthesias, dizziness. Denies foreign bodies to eyes, foreign body sensation to eyes, vision changes. Denies welding. Denies chemical exposure or sick contacts. Patient does report his father recently passing away of [employer] syndrome Luis Wallace, and he reports some concern of this as father had some viral symptoms prior to the progression of Luis Wallace. Patient denies any facial asymmetry or weak in facial movements. Denies any paresthesias, weakness, shortness of breath difficulty walking or other complaints.  PCP: Luis ScottsNiemeyer  Past Medical History:  Diagnosis Date  . Asthma   . Hypertension   . Spontaneous pneumothorax    Age 63    There are no active problems to display for this patient.   Past Surgical History:  Procedure Laterality Date  . CARDIAC SURGERY    . TONSILLECTOMY      No current  facility-administered medications for this encounter.   Current Outpatient Prescriptions:  .  cetirizine (ZYRTEC) 10 MG tablet, Take 10 mg by mouth at bedtime., Disp: , Rfl:  .  fluticasone (FLONASE) 50 MCG/ACT nasal spray, Place 2 sprays into both nostrils daily., Disp: 16 g, Rfl: 2 .  metoprolol (LOPRESSOR) 100 MG tablet, Take 100 mg by mouth 2 (two) times daily., Disp: , Rfl:  .  montelukast (SINGULAIR) 10 MG tablet, Take 10 mg by mouth at bedtime., Disp: , Rfl:  .  quinapril-hydrochlorothiazide (ACCURETIC) 10-12.5 MG per tablet, Take 1 tablet by mouth daily., Disp: , Rfl:  .  albuterol (PROVENTIL HFA;VENTOLIN HFA) 108 (90 Base) MCG/ACT inhaler, Inhale 2 puffs into the lungs every 4 (four) hours as needed., Disp: 1 Inhaler, Rfl: 0 .  azithromycin (ZITHROMAX Z-PAK) 250 MG tablet, Take 2 tablets first day and then 1 po a day for 4 days, Disp: 6 tablet, Rfl: 0 .  benzonatate (TESSALON) 100 MG capsule, Take 1 capsule (100 mg total) by mouth 3 (three) times daily as needed., Disp: 30 capsule, Rfl: 0 .  chlorpheniramine-HYDROcodone (TUSSIONEX PENNKINETIC ER) 10-8 MG/5ML SUER, Take 5 mLs by mouth every 12 (twelve) hours as needed for cough., Disp: 115 mL, Rfl: 0 .  doxycycline (VIBRAMYCIN) 100 MG capsule, Take 1 capsule (100 mg total) by mouth 2 (two) times daily., Disp: 20 capsule, Rfl: 0 .  erythromycin ophthalmic ointment, Place 1 application into both eyes 4 (four) times daily. For seven days, Disp: 3.5 g, Rfl: 0 .  oseltamivir (TAMIFLU) 75 MG capsule, Take 1 capsule (75 mg total) by mouth every 12 (twelve) hours., Disp: 10 capsule, Rfl: 0 .  predniSONE (STERAPRED  UNI-PAK 21 TAB) 10 MG (21) TBPK tablet, Take 1 tablet (10 mg total) by mouth daily. 6-5-4-3-2-1- tablets  Days 1-2-3-4-5 as previously, Disp: 21 tablet, Rfl: 0  Allergies Sulfa antibiotics; Augmentin [amoxicillin-pot clavulanate]; Ceclor [cefaclor]; and Penicillins  Family History  Problem Relation Age of Onset  . Pulmonary embolism  Mother   . Deep vein thrombosis Mother   . Aortic aneurysm Father     Social History Social History  Substance Use Topics  . Smoking status: Never Smoker  . Smokeless tobacco: Never Used  . Alcohol use Yes     Comment: socially    Review of Systems Constitutional: As above. Eyes: No visual changes. ENT: As above.  Cardiovascular: Denies chest pain. Respiratory: Denies shortness of breath. Gastrointestinal: No abdominal pain.  No nausea, no vomiting.  No diarrhea.  No constipation. Genitourinary: Negative for dysuria. Musculoskeletal: Negative for back pain. Skin: Negative for rash. Neurological: Negative for headaches, focal weakness or numbness.  10-point ROS otherwise negative.  ____________________________________________   PHYSICAL EXAM:  VITAL SIGNS: ED Triage Vitals  Enc Vitals Group     BP 10/01/16 0844 133/89     Pulse Rate 10/01/16 0844 92     Resp 10/01/16 0844 16     Temp 10/01/16 0844 99.2 F (37.3 C)     Temp Source 10/01/16 0844 Oral     SpO2 10/01/16 0844 96 %     Weight 10/01/16 0846 (!) 305 lb (138.3 kg)     Height 10/01/16 0846 5\' 11"  (1.803 m)     Head Circumference --      Peak Flow --      Pain Score 10/01/16 0947 2     Pain Loc --      Pain Edu? --      Excl. in GC? --      Visual Acuity  Right Eye Distance: 20/20 Left Eye Distance: 20/20 Bilateral Distance: 20/15    Constitutional: Alert and oriented. Well appearing and in no acute distress. Eyes: Bilateral conjunctivae mild injection, left worse than right. Bilateral eyes with greenish exudate and mild crusting along the eyelash margin present. Nontender to palpation bilaterally. No surrounding erythema, swelling or tenderness.Marland Kitchen PERRL. EOMI. no pain with EOMs. Head: Atraumatic. No sinus tenderness to palpation. No swelling. No erythema.  Ears: no erythema, normal TMs bilaterally.   Nose:Nasal congestion with clear rhinorrhea  Mouth/Throat: Mucous membranes are moist. Mild  pharyngeal erythema. No tonsillar swelling or exudate.  Neck: No stridor.  No cervical spine tenderness to palpation. Hematological/Lymphatic/Immunilogical: No cervical lymphadenopathy. Cardiovascular: Normal rate, regular rhythm. Grossly normal heart sounds.  Good peripheral circulation. Respiratory: Normal respiratory effort.  No retractions. Lungs CTAB.No wheezes, rales or rhonchi. Good air movement.  Gastrointestinal: Soft and nontender. No CVA tenderness. Musculoskeletal: No lower or upper extremity tenderness nor edema. No cervical, thoracic or lumbar tenderness to palpation. Neurologic:  Normal speech and language. No gross focal neurologic deficits are appreciated. No gait instability. Negative Romberg. No ataxia. Normal finger to nose, normal heel-to-shin. Steady gait. Bilateral face, upper extremities and lower extremities with equal sensation. 2+ biceps, patellar and Achilles reflexes present bilaterally. Skin:  Skin is warm, dry and intact. No rash noted. Psychiatric: Mood and affect are normal. Speech and behavior are normal.  ___________________________________________   LABS (all labs ordered are listed, but only abnormal results are displayed)  Labs Reviewed  RAPID STREP SCREEN (NOT AT [facility])  RAPID INFLUENZA A&B ANTIGENS ([facility] ONLY)  CULTURE, GROUP A STREP [facility])  PROCEDURES Procedures    INITIAL IMPRESSION / ASSESSMENT AND PLAN / ED COURSE  Pertinent labs & imaging results that were available during my care of the patient were reviewed by me and considered in my medical decision making (see chart for details).  Well-appearing patient. No acute distress. Suspect influenza. Influenza test negative, however discussed with patient concern for false negative. Strep negative, will culture. Also suspect bacterial conjunctivitis. Discussed treatment options with patient, as well as the use of Tamiflu. Will treat patient with oral Tamiflu, erythromycin ophthalmic ointment.  Encourage rest, fluids and supportive care. Over-the-counter Tylenol or ibuprofen as needed. Discussed in detail with patient to monitor self as well as discussed symptoms of Luis Catalina and follow-up promptly as needed.Discussed indication, risks and benefits of medications with patient.Patient had also requested a refill prescription of his home albuterol prescription, Rx given.  Discussed follow up with Primary care physician this week. Discussed follow up and return parameters including no resolution or any worsening concerns. Patient verbalized understanding and agreed to plan.   ____________________________________________   FINAL CLINICAL IMPRESSION(S) / ED DIAGNOSES  Final diagnoses:  Influenza-like illness  Conjunctivitis of both eyes, unspecified conjunctivitis type     Discharge Medication List as of 10/01/2016  9:43 AM    START taking these medications   Details  erythromycin ophthalmic ointment Place 1 application into both eyes 4 (four) times daily. For seven days, Starting Mon 10/01/2016, Normal    oseltamivir (TAMIFLU) 75 MG capsule Take 1 capsule (75 mg total) by mouth every 12 (twelve) hours., Starting Mon 10/01/2016, Normal        Note: This dictation was prepared with Dragon dictation along with smaller phrase technology. Any transcriptional errors that result from this process are unintentional.         Renford Dills, NP 10/01/16 1027    Renford Dills, NP 10/01/16 1308

## 2016-10-04 LAB — CULTURE, GROUP A STREP (THRC)

## 2017-10-30 ENCOUNTER — Encounter: Payer: Self-pay | Admitting: *Deleted

## 2017-10-30 ENCOUNTER — Ambulatory Visit
Admission: EM | Admit: 2017-10-30 | Discharge: 2017-10-30 | Disposition: A | Discharge status: Home / Self Care | Payer: Self-pay | Attending: Family Medicine | Admitting: Family Medicine

## 2017-10-30 ENCOUNTER — Other Ambulatory Visit: Payer: Self-pay

## 2017-10-30 DIAGNOSIS — J4521 Mild intermittent asthma with (acute) exacerbation: Secondary | ICD-10-CM

## 2017-10-30 DIAGNOSIS — J40 Bronchitis, not specified as acute or chronic: Secondary | ICD-10-CM

## 2017-10-30 MED ORDER — AZITHROMYCIN 250 MG PO TABS: ORAL_TABLET | ORAL | 0 refills | Status: DC

## 2017-10-30 MED ORDER — PREDNISONE 20 MG PO TABS: ORAL_TABLET | ORAL | 0 refills | Status: DC

## 2017-10-30 NOTE — ED Triage Notes (Signed)
Patient started having symptoms of chest congestion and productive cough 1 week ago. Patient reports a sore throat at the beginning that has resolved.

## 2017-10-30 NOTE — ED Provider Notes (Signed)
[facility]    CSN: [id number] Arrival date & time: 10/30/17  1555     History   Chief Complaint Chief Complaint  Patient presents with  . Cough    HPI Luis Wallace is a 42 y.o. male.   The history is provided by the patient.  URI  Presenting symptoms: congestion and cough   Severity:  Moderate Onset quality:  Sudden Duration:  1 week Timing:  Constant Progression:  Worsening Chronicity:  New Relieved by:  Nothing Ineffective treatments:  OTC medications Associated symptoms: wheezing   Risk factors: chronic respiratory disease (asthma) and sick contacts   Risk factors: not elderly, no chronic cardiac disease, no chronic kidney disease, no diabetes mellitus, no immunosuppression, no recent illness and no recent travel     Past Medical History:  Diagnosis Date  . Asthma   . Hypertension   . Spontaneous pneumothorax    Age 75    There are no active problems to display for this patient.   Past Surgical History:  Procedure Laterality Date  . CARDIAC SURGERY    . TONSILLECTOMY         Home Medications    Prior to Admission medications   Medication Sig Start Date End Date Taking? Authorizing Provider  albuterol (PROVENTIL HFA;VENTOLIN HFA) 108 (90 Base) MCG/ACT inhaler Inhale 2 puffs into the lungs every 4 (four) hours as needed. 10/01/16  Yes Renford Dills, NP  cetirizine (ZYRTEC) 10 MG tablet Take 10 mg by mouth at bedtime.   Yes [provider]  azithromycin (ZITHROMAX Z-PAK) 250 MG tablet 2 tabs po once day 1, then 1 tab po qd for next 4 days 10/30/17   Payton Mccallum, MD  benzonatate (TESSALON) 100 MG capsule Take 1 capsule (100 mg total) by mouth 3 (three) times daily as needed. 05/20/15   Rae Halsted, PA-C  chlorpheniramine-HYDROcodone (TUSSIONEX PENNKINETIC ER) 10-8 MG/5ML SUER Take 5 mLs by mouth every 12 (twelve) hours as needed for cough. 04/12/15   Hassan Rowan, MD  doxycycline (VIBRAMYCIN) 100 MG capsule Take 1 capsule  (100 mg total) by mouth 2 (two) times daily. 05/20/15   Rae Halsted, PA-C  erythromycin ophthalmic ointment Place 1 application into both eyes 4 (four) times daily. For seven days 10/01/16   Renford Dills, NP  fluticasone [facility]) 50 MCG/ACT nasal spray Place 2 sprays into both nostrils daily. 05/20/15   Rae Halsted, PA-C  metoprolol (LOPRESSOR) 100 MG tablet Take 100 mg by mouth 2 (two) times daily.    [provider]  montelukast (SINGULAIR) 10 MG tablet Take 10 mg by mouth at bedtime.    [provider]  oseltamivir (TAMIFLU) 75 MG capsule Take 1 capsule (75 mg total) by mouth every 12 (twelve) hours. 10/01/16   Renford Dills, NP  predniSONE (DELTASONE) 20 MG tablet 3 tabs po qd x 2 days, then 2 tabs po qd x 2 days, then 1 tab po qd x 2 days, then half a tab po qd x 2 days 10/30/17   Payton Mccallum, MD  quinapril-hydrochlorothiazide (ACCURETIC) 10-12.5 MG per tablet Take 1 tablet by mouth daily.    [provider]    Family History Family History  Problem Relation Age of Onset  . Pulmonary embolism Mother   . Deep vein thrombosis Mother   . Aortic aneurysm Father     Social History Social History   Tobacco Use  . Smoking status: Never Smoker  . Smokeless tobacco: Never Used  Substance Use Topics  . Alcohol use: Yes    Comment: socially  . Drug use: No     Allergies   Sulfa antibiotics; Augmentin [amoxicillin-pot clavulanate]; Ceclor [cefaclor]; and Penicillins   Review of Systems Review of Systems  HENT: Positive for congestion.   Respiratory: Positive for cough and wheezing.      Physical Exam Triage Vital Signs ED Triage Vitals  Enc Vitals Group     BP 10/30/17 1620 (!) 162/95     Pulse Rate 10/30/17 1620 86     Resp 10/30/17 1620 18     Temp 10/30/17 1620 99 F (37.2 C)     Temp Source 10/30/17 1620 Oral     SpO2 10/30/17 1620 97 %     Weight 10/30/17 1621 (!) 305 lb (138.3 kg)     Height 10/30/17 1621 5\' 10"  (1.778 m)     Head  Circumference --      Peak Flow --      Pain Score 10/30/17 1620 0     Pain Loc --      Pain Edu? --      Excl. in GC? --    No data found.  Updated Vital Signs BP (!) 162/95 (BP Location: Left Arm)   Pulse 86   Temp 99 F (37.2 C) (Oral)   Resp 18   Ht 5\' 10"  (1.778 m)   Wt (!) 305 lb (138.3 kg)   SpO2 97%   BMI 43.76 kg/m   Visual Acuity Right Eye Distance:   Left Eye Distance:   Bilateral Distance:    Right Eye Near:   Left Eye Near:    Bilateral Near:     Physical Exam  Constitutional: He appears well-developed and well-nourished. No distress.  HENT:  Head: Normocephalic and atraumatic.  Right Ear: Tympanic membrane, external ear and ear canal normal.  Left Ear: Tympanic membrane, external ear and ear canal normal.  Nose: Nose normal.  Mouth/Throat: Uvula is midline, oropharynx is clear and moist and mucous membranes are normal. No oropharyngeal exudate or tonsillar abscesses.  Eyes: Conjunctivae and EOM are normal. Pupils are equal, round, and reactive to light. Right eye exhibits no discharge. Left eye exhibits no discharge. No scleral icterus.  Neck: Normal range of motion. Neck supple. No tracheal deviation present. No thyromegaly present.  Cardiovascular: Normal rate, regular rhythm and normal heart sounds.  Pulmonary/Chest: Effort normal. No stridor. No respiratory distress. He has wheezes (and diffuse rhonchi). He has no rales. He exhibits no tenderness.  Lymphadenopathy:    He has no cervical adenopathy.  Neurological: He is alert.  Skin: Skin is warm and dry. No rash noted. He is not diaphoretic.  Nursing note and vitals reviewed.    UC Treatments / Results  Labs (all labs ordered are listed, but only abnormal results are displayed) Labs Reviewed - No data to display  EKG  EKG Interpretation None       Radiology No results found.  Procedures Procedures (including critical care time)  Medications Ordered in UC Medications - No data to  display   Initial Impression / Assessment and Plan / UC Course  I have reviewed the triage vital signs and the nursing notes.  Pertinent labs & imaging results that were available during my care of the patient were reviewed by me and considered in my medical decision making (see chart for details).       Final Clinical Impressions(s) / UC Diagnoses   Final diagnoses:  Mild intermittent asthmatic bronchitis with acute exacerbation    ED Discharge Orders        Ordered    azithromycin (ZITHROMAX Z-PAK) 250 MG tablet     10/30/17 1639    predniSONE (DELTASONE) 20 MG tablet     10/30/17 1639     1. diagnosis reviewed with patient 2. rx as per orders above; reviewed possible side effects, interactions, risks and benefits  3. Recommend supportive treatment with rest, fluids; continue current home inhalers  4. Follow-up prn if symptoms worsen or don't improve  Controlled Substance Prescriptions [place] Controlled Substance Registry consulted? Not Applicable   Payton Mccallum, MD 10/30/17 [phone]

## 2019-09-27 ENCOUNTER — Emergency Department
Admission: EM | Admit: 2019-09-27 | Discharge: 2019-09-27 | Disposition: A | Discharge status: Home / Self Care | Payer: Self-pay | Attending: Emergency Medicine | Admitting: Emergency Medicine

## 2019-09-27 ENCOUNTER — Other Ambulatory Visit: Payer: Self-pay

## 2019-09-27 ENCOUNTER — Encounter: Payer: Self-pay | Admitting: Emergency Medicine

## 2019-09-27 ENCOUNTER — Emergency Department: Payer: Self-pay

## 2019-09-27 DIAGNOSIS — I10 Essential (primary) hypertension: Secondary | ICD-10-CM | POA: Insufficient documentation

## 2019-09-27 DIAGNOSIS — R0789 Other chest pain: Secondary | ICD-10-CM | POA: Insufficient documentation

## 2019-09-27 DIAGNOSIS — J45909 Unspecified asthma, uncomplicated: Secondary | ICD-10-CM | POA: Insufficient documentation

## 2019-09-27 DIAGNOSIS — Z79899 Other long term (current) drug therapy: Secondary | ICD-10-CM | POA: Insufficient documentation

## 2019-09-27 LAB — TROPONIN I (HIGH SENSITIVITY)
Troponin I (High Sensitivity): 3 ng/L (ref <18)
Troponin I (High Sensitivity): 3 ng/L (ref <18)

## 2019-09-27 LAB — CBC
HCT: 43.4 % (ref 39.0–52.0)
Hemoglobin: 14 g/dL (ref 13.0–17.0)
MCH: 25.8 pg — ABNORMAL LOW (ref 26.0–34.0)
MCHC: 32.3 g/dL (ref 30.0–36.0)
MCV: 80.1 fL (ref 80.0–100.0)
Platelets: 379 10*3/uL (ref 150–400)
RBC: 5.42 MIL/uL (ref 4.22–5.81)
RDW: 15.7 % — ABNORMAL HIGH (ref 11.5–15.5)
WBC: 11 10*3/uL — ABNORMAL HIGH (ref 4.0–10.5)
nRBC: 0 % (ref 0.0–0.2)

## 2019-09-27 LAB — BASIC METABOLIC PANEL
Anion gap: 8 (ref 5–15)
BUN: 14 mg/dL (ref 6–20)
CO2: 27 mmol/L (ref 22–32)
Calcium: 8.9 mg/dL (ref 8.9–10.3)
Chloride: 104 mmol/L (ref 98–111)
Creatinine, Ser: 0.97 mg/dL (ref 0.61–1.24)
GFR calc Af Amer: >60 mL/min (ref >60)
GFR calc non Af Amer: >60 mL/min (ref >60)
Glucose, Bld: 116 mg/dL — ABNORMAL HIGH (ref 70–99)
Potassium: 4.1 mmol/L (ref 3.5–5.1)
Sodium: 139 mmol/L (ref 135–145)

## 2019-09-27 MED ORDER — IOHEXOL 350 MG/ML SOLN
125.0000 mL | Freq: Once | INTRAVENOUS | Status: AC | PRN
  Administered 2019-09-27: 125 mL via INTRAVENOUS

## 2019-09-27 MED ORDER — ONDANSETRON HCL 4 MG/2ML IJ SOLN
4.0000 mg | Freq: Once | INTRAMUSCULAR | Status: AC
  Administered 2019-09-27: 4 mg via INTRAVENOUS
  Filled 2019-09-27: qty 2

## 2019-09-27 MED ORDER — MORPHINE SULFATE (PF) 4 MG/ML IV SOLN
4.0000 mg | Freq: Once | INTRAVENOUS | Status: AC
  Administered 2019-09-27: 4 mg via INTRAVENOUS
  Filled 2019-09-27: qty 1

## 2019-09-27 NOTE — ED Notes (Signed)
Pt to front desk. Pt stated that he had just came out of the bathroom where he had vomited up some blood. Pt states that he has not been previously vomiting. Pt appears diaphoretic. Pt states that it seems like the pain in his back and chest is getting worse. EDT Zach called to recheck pts vital signs. Message sent to charge RN.

## 2019-09-27 NOTE — ED Triage Notes (Signed)
Pt arrived via [facility] with CP x 1 hour in middle of chest radiating to mid-back.  Pt has hx of cardiac cath in 2015 by Dr. Lady Gary, no stents placed, takes Toprol nightly.  Pt diaphoretic on arrival and reports shortness of breath with the pain.

## 2019-09-27 NOTE — ED Provider Notes (Signed)
[facility] Emergency Department Provider Note ____________________________________________   First MD Initiated Contact with Patient 09/27/19 1510     (approximate)  I have reviewed the triage vital signs and the nursing notes.   HISTORY  Chief Complaint Chest Pain    HPI Luis Wallace is a 44 y.o. male with PMH as noted below including hypertension who presents with chest pain, acute onset around 4 hours ago, constant since then, and described as pressure-like and in the center of his chest.  He states that he also has pain in the middle of his back.  He reports nausea and had one episode of vomiting, but denies any abdominal pain.  He has had no lightheadedness but does feel like this pain intermittently "takes my breath away."  He denies ongoing shortness of breath.  The patient states that when he vomited once in the ER, there were small specks of blood.  He otherwise has not had any vomiting of blood or dark or bloody stools.  He denies any prior history of similar chest pain.  He states he had a cardiac work-up about 5 years ago including a negative cardiac catheterization.  He has a family history of PE and of aortic aneurysm.  Past Medical History:  Diagnosis Date  . Asthma   . Hypertension   . Spontaneous pneumothorax    Age 52    There are no problems to display for this patient.   Past Surgical History:  Procedure Laterality Date  . CARDIAC SURGERY    . TONSILLECTOMY      Prior to Admission medications   Medication Sig Start Date End Date Taking? Authorizing Provider  albuterol (PROVENTIL HFA;VENTOLIN HFA) 108 (90 Base) MCG/ACT inhaler Inhale 2 puffs into the lungs every 4 (four) hours as needed. 10/01/16  Yes Renford Dills, NP  buPROPion (WELLBUTRIN XL) 150 MG 24 hr tablet Take 150 mg by mouth daily.   Yes [provider]  metoprolol succinate (TOPROL-XL) 100 MG 24 hr tablet Take 100 mg by mouth daily. Take with or  immediately following a meal.   Yes [provider]  montelukast (SINGULAIR) 10 MG tablet Take 10 mg by mouth at bedtime.   Yes [provider]  quinapril-hydrochlorothiazide (ACCURETIC) 10-12.5 MG per tablet Take 1 tablet by mouth daily.   Yes [provider]  sertraline (ZOLOFT) 50 MG tablet Take 50 mg by mouth daily.   Yes [provider]  predniSONE (DELTASONE) 20 MG tablet 3 tabs po qd x 2 days, then 2 tabs po qd x 2 days, then 1 tab po qd x 2 days, then half a tab po qd x 2 days Patient not taking: Reported on 09/27/2019 10/30/17   Payton Mccallum, MD    Allergies Cefaclor, Other, Sulfa antibiotics, Augmentin [amoxicillin-pot clavulanate], and Penicillins  Family History  Problem Relation Age of Onset  . Pulmonary embolism Mother   . Deep vein thrombosis Mother   . Aortic aneurysm Father     Social History Social History   Tobacco Use  . Smoking status: Never Smoker  . Smokeless tobacco: Never Used  Substance Use Topics  . Alcohol use: Yes    Comment: socially  . Drug use: No    Review of Systems  Constitutional: No fever. Eyes: No redness. ENT: No neck pain Cardiovascular: Positive for chest pain. Respiratory: Denies shortness of breath. Gastrointestinal: No diarrhea.  Genitourinary: Negative for flank pain. Musculoskeletal: Positive for back pain. Skin: Negative for rash.  Neurological: Negative for headaches, focal weakness or numbness.   ____________________________________________   PHYSICAL EXAM:  VITAL SIGNS: ED Triage Vitals  Enc Vitals Group     BP 09/27/19 1147 101/60     Pulse Rate 09/27/19 1147 76     Resp 09/27/19 1147 18     Temp 09/27/19 1147 98.3 F (36.8 C)     Temp Source 09/27/19 1147 Oral     SpO2 09/27/19 1147 100 %     Weight 09/27/19 1148 (!) 340 lb (154.2 kg)     Height 09/27/19 1148 5\' 11"  (1.803 m)     Head Circumference --      Peak Flow --      Pain Score 09/27/19 1147 8     Pain Loc --        Pain Edu? --      Excl. in GC? --     Constitutional: Alert and oriented.  Relatively well appearing and in no acute distress. Eyes: Conjunctivae are normal.  Head: Atraumatic. Nose: No congestion/rhinnorhea. Mouth/Throat: Mucous membranes are moist.   Neck: Normal range of motion.  Cardiovascular: Normal rate, regular rhythm.  Good peripheral circulation. Respiratory: Normal respiratory effort.  No retractions.  Gastrointestinal: No distention.  Musculoskeletal: No lower extremity edema.  Extremities warm and well perfused.  Neurologic:  Normal speech and language. No gross focal neurologic deficits are appreciated.  Skin:  Skin is warm and dry. No rash noted. Psychiatric: Mood and affect are normal. Speech and behavior are normal.  ____________________________________________   LABS (all labs ordered are listed, but only abnormal results are displayed)  Labs Reviewed  BASIC METABOLIC PANEL - Abnormal; Notable for the following components:      Result Value   Glucose, Bld 116 (*)    All other components within normal limits  CBC - Abnormal; Notable for the following components:   WBC 11.0 (*)    MCH 25.8 (*)    RDW 15.7 (*)    All other components within normal limits  TROPONIN I (HIGH SENSITIVITY)  TROPONIN I (HIGH SENSITIVITY)   ____________________________________________  EKG  ED ECG REPORT I, 09/29/19, the attending physician, personally viewed and interpreted this ECG.  Date: 09/27/2019 EKG Time: 1141 Rate: 70 Rhythm: normal sinus rhythm QRS Axis: normal Intervals: normal ST/T Wave abnormalities: normal Narrative Interpretation: no evidence of acute ischemia  ____________________________________________  RADIOLOGY  CXR: No focal infiltrate or other acute abnormality  ____________________________________________   PROCEDURES  Procedure(s) performed: No  Procedures  Critical Care performed:  No ____________________________________________   INITIAL IMPRESSION / ASSESSMENT AND PLAN / ED COURSE  Pertinent labs & imaging results that were available during my care of the patient were reviewed by me and considered in my medical decision making (see chart for details).  44 year old male with a history of hypertension and other PMH as noted above presents with acute onset of nonexertional central chest pain with some pain in the middle of his back as well over the last several hours.  He had one episode of vomiting in the ED.  He denies any prior history of this pain.  I reviewed the past medical records in Epic.  The patient has no recent prior ED visits or admissions.  He has no known cardiac history.  He reports that of around 5 years ago he had some chest pain and had a cardiology evaluation including catheterization, but this was negative.  I do not see any records in Epic from that  long ago.  On exam he is overall well-appearing.  His vital signs are normal.  The physical exam is unremarkable.  EKG is nonischemic.  Overall given the patient's well appearance and reassuring initial work-up, I have a low suspicion for ACS, PE, or vascular etiology.  He has no tachycardia or hypoxia.  However, the family history of both PE and aortic dissection as well as the acute onset of the pain and radiation to the back do concern me somewhat for these etiologies.  We will obtain cardiac enzymes x2, as well as a CT angiogram of the chest/abdomen/pelvis for further evaluation.  If the work-up is negative I anticipate discharge home.  ----------------------------------------- 5:20 PM on 09/27/2019 -----------------------------------------  CT shows no acute abnormalities.  The patient reports improved pain.  The repeat troponin is negative.  At this time, the patient is stable for discharge home.  Return precautions provided, and he expresses  understanding. ____________________________________________   FINAL CLINICAL IMPRESSION(S) / ED DIAGNOSES  Final diagnoses:  Atypical chest pain      NEW MEDICATIONS STARTED DURING THIS VISIT:  New Prescriptions   No medications on file     Note:  This document was prepared using Dragon voice recognition software and may include unintentional dictation errors.    Arta Silence, MD 09/27/19 1721

## 2019-09-27 NOTE — Discharge Instructions (Addendum)
Return to the ER for new, worsening, or persistent severe chest pain, difficulty breathing, weakness or lightheadedness or any other new or worsening symptoms that concern you.  You should restart on an acid blocking medication such as Prilosec (omeprazole) for the next few weeks, and you can take over-the-counter ibuprofen or Tylenol as needed for pain.  Follow-up with your regular doctor.

## 2020-07-04 ENCOUNTER — Ambulatory Visit: Payer: Self-pay | Attending: Internal Medicine

## 2020-07-04 DIAGNOSIS — Z23 Encounter for immunization: Secondary | ICD-10-CM

## 2020-07-04 NOTE — Progress Notes (Signed)
   Covid-19 Vaccination Clinic  Name:  Luis Wallace    MRN: [id number] DOB: 1975-10-28  07/04/2020  Luis Wallace was observed post Covid-19 immunization for 15 minutes without incident. He was provided with Vaccine Information Sheet and instruction to access the V-Safe system.   Luis Wallace was instructed to call 911 with any severe reactions post vaccine: Marland Kitchen Difficulty breathing  . Swelling of face and throat  . A fast heartbeat  . A bad rash all over body  . Dizziness and weakness

## 2021-11-19 ENCOUNTER — Ambulatory Visit
Admission: EM | Admit: 2021-11-19 | Discharge: 2021-11-19 | Disposition: A | Discharge status: Home / Self Care | Payer: Managed Care, Other (non HMO) | Attending: Emergency Medicine | Admitting: Emergency Medicine

## 2021-11-19 ENCOUNTER — Encounter: Payer: Self-pay | Admitting: Emergency Medicine

## 2021-11-19 ENCOUNTER — Ambulatory Visit (INDEPENDENT_AMBULATORY_CARE_PROVIDER_SITE_OTHER): Payer: Managed Care, Other (non HMO)

## 2021-11-19 DIAGNOSIS — R059 Cough, unspecified: Secondary | ICD-10-CM

## 2021-11-19 DIAGNOSIS — R0602 Shortness of breath: Secondary | ICD-10-CM

## 2021-11-19 DIAGNOSIS — R058 Other specified cough: Secondary | ICD-10-CM

## 2021-11-19 DIAGNOSIS — R0989 Other specified symptoms and signs involving the circulatory and respiratory systems: Secondary | ICD-10-CM

## 2021-11-19 DIAGNOSIS — J45901 Unspecified asthma with (acute) exacerbation: Secondary | ICD-10-CM

## 2021-11-19 MED ORDER — PREDNISONE 10 MG PO TABS: 40.0000 mg | ORAL_TABLET | Freq: Every day | ORAL | 0 refills | Status: AC

## 2021-11-19 MED ORDER — ALBUTEROL SULFATE (2.5 MG/3ML) 0.083% IN NEBU
2.5000 mg | INHALATION_SOLUTION | Freq: Once | RESPIRATORY_TRACT | Status: AC
  Administered 2021-11-19: 2.5 mg via RESPIRATORY_TRACT

## 2021-11-19 NOTE — Discharge Instructions (Addendum)
Your chest xray is normal.   ? ?Take the prednisone as directed.  Continue using the albuterol inhaler and nebulizer as directed.   ? ?Follow up with your primary care provider if your symptoms are not improving.   ? ?Go to the emergency department if you have shortness of breath or other concerning symptoms.   ? ? ?

## 2021-11-19 NOTE — ED Provider Notes (Signed)
?[facility] ? ? ? ?CSN: [id number] ?Arrival date & time: 11/19/21  [id number] ? ? ?  ? ?History   ?Chief Complaint ?Chief Complaint  ?Patient presents with  ? Sore Throat  ? Cough  ? Nasal Congestion  ? Chest Congestion  ? ? ?HPI ?Laretta AlstromJoshua Lawrence Piehl is a 46 y.o. male.  Patient presents with 3-day history of congestion, cough, wheezing, shortness of breath.  Had a sore throat at the onset of his symptoms but this is improved.  Treatment at home with albuterol inhaler and albuterol nebulizer treatments.  He denies fever, earache, rash, vomiting, diarrhea, or other symptoms.  His medical history includes asthma, hypertension, spontaneous pneumothorax, morbid obesity. ? ?The history is provided by the patient and medical records.  ? ?Past Medical History:  ?Diagnosis Date  ? Asthma   ? Hypertension   ? Spontaneous pneumothorax   ? Age 46  ? ? ?There are no problems to display for this patient. ? ? ?Past Surgical History:  ?Procedure Laterality Date  ? CARDIAC SURGERY    ? TONSILLECTOMY    ? ? ? ? ? ?Home Medications   ? ?Prior to Admission medications   ?Medication Sig Start Date End Date Taking? Authorizing Provider  ?predniSONE (DELTASONE) 10 MG tablet Take 4 tablets (40 mg total) by mouth daily for 5 days. 11/19/21 11/24/21 Yes Mickie Bailate, Dorothy Landgrebe H, NP  ?albuterol (PROVENTIL HFA;VENTOLIN HFA) 108 (90 Base) MCG/ACT inhaler Inhale 2 puffs into the lungs every 4 (four) hours as needed. 10/01/16   Renford DillsMiller, Lindsey, NP  ?buPROPion (WELLBUTRIN XL) 150 MG 24 hr tablet Take 150 mg by mouth daily.    [provider]  ?metoprolol succinate (TOPROL-XL) 100 MG 24 hr tablet Take 100 mg by mouth daily. Take with or immediately following a meal.    [provider]  ?montelukast (SINGULAIR) 10 MG tablet Take 10 mg by mouth at bedtime.    [provider]  ?quinapril-hydrochlorothiazide (ACCURETIC) 10-12.5 MG per tablet Take 1 tablet by mouth daily.    [provider]  ?sertraline (ZOLOFT) 50 MG tablet  Take 50 mg by mouth daily.    [provider]  ? ? ?Family History ?Family History  ?Problem Relation Age of Onset  ? Pulmonary embolism Mother   ? Deep vein thrombosis Mother   ? Aortic aneurysm Father   ? ? ?Social History ?Social History  ? ?Tobacco Use  ? Smoking status: Never  ? Smokeless tobacco: Never  ?Substance Use Topics  ? Alcohol use: Yes  ?  Comment: socially  ? Drug use: No  ? ? ? ?Allergies   ?Cefaclor, Other, Sulfa antibiotics, Augmentin [amoxicillin-pot clavulanate], and Penicillins ? ? ?Review of Systems ?Review of Systems  ?Constitutional:  Negative for chills and fever.  ?HENT:  Positive for congestion and sore throat. Negative for ear pain.   ?Respiratory:  Positive for cough, shortness of breath and wheezing.   ?Cardiovascular:  Negative for chest pain and palpitations.  ?Gastrointestinal:  Negative for diarrhea and vomiting.  ?Skin:  Negative for color change and rash.  ?All other systems reviewed and are negative. ? ? ?Physical Exam ?Triage Vital Signs ?ED Triage Vitals  ?Enc Vitals Group  ?   BP   ?   Pulse   ?   Resp   ?   Temp   ?   Temp src   ?   SpO2   ?   Weight   ?   Height   ?  Head Circumference   ?   Peak Flow   ?   Pain Score   ?   Pain Loc   ?   Pain Edu?   ?   Excl. in GC?   ? ?No data found. ? ?Updated Vital Signs ?BP 135/86   Pulse 88   Temp 98.4 ?F (36.9 ?C)   Resp 20   SpO2 95%  ? ?Visual Acuity ?Right Eye Distance:   ?Left Eye Distance:   ?Bilateral Distance:   ? ?Right Eye Near:   ?Left Eye Near:    ?Bilateral Near:    ? ?Physical Exam ?Vitals and nursing note reviewed.  ?Constitutional:   ?   General: He is not in acute distress. ?   Appearance: He is well-developed. He is obese. He is not ill-appearing.  ?HENT:  ?   Right Ear: Tympanic membrane normal.  ?   Left Ear: Tympanic membrane normal.  ?   Nose: Nose normal.  ?   Mouth/Throat:  ?   Mouth: Mucous membranes are moist.  ?   Pharynx: Oropharynx is clear.  ?Cardiovascular:  ?   Rate and Rhythm: Normal  rate and regular rhythm.  ?   Heart sounds: Normal heart sounds.  ?Pulmonary:  ?   Effort: Pulmonary effort is normal. No respiratory distress.  ?   Breath sounds: Wheezing present.  ?   Comments: Inspiratory and expiratory wheezing throughout.  ?Abdominal:  ?   Palpations: Abdomen is soft.  ?   Tenderness: There is no abdominal tenderness.  ?Musculoskeletal:  ?   Cervical back: Neck supple.  ?Skin: ?   General: Skin is warm and dry.  ?Neurological:  ?   Mental Status: He is alert.  ?Psychiatric:     ?   Mood and Affect: Mood normal.     ?   Behavior: Behavior normal.  ? ? ? ?UC Treatments / Results  ?Labs ?(all labs ordered are listed, but only abnormal results are displayed) ?Labs Reviewed  ?NOVEL CORONAVIRUS, NAA  ? ? ?EKG ? ? ?Radiology ?DG Chest 2 View ? ?Result Date: 11/19/2021 ?CLINICAL DATA:  Cough and chest congestion. EXAM: CHEST - 2 VIEW COMPARISON:  09/27/2019 and prior studies FINDINGS: The cardiomediastinal silhouette is unremarkable. Mild chronic peribronchial thickening again noted. There is no evidence of focal airspace disease, pulmonary edema, suspicious pulmonary nodule/mass, pleural effusion, or pneumothorax. No acute bony abnormalities are identified. IMPRESSION: No active cardiopulmonary disease. Electronically Signed   By: Harmon Pier M.D.   On: 11/19/2021 10:40   ? ?Procedures ?Procedures (including critical care time) ? ?Medications Ordered in UC ?Medications  ?albuterol (PROVENTIL) (2.5 MG/3ML) 0.083% nebulizer solution 2.5 mg (has no administration in time range)  ? ? ?Initial Impression / Assessment and Plan / UC Course  ?I have reviewed the triage vital signs and the nursing notes. ? ?Pertinent labs & imaging results that were available during my care of the patient were reviewed by me and considered in my medical decision making (see chart for details). ? ? Asthma exacerbation, productive cough, shortness of breath.  Chest x-ray negative.  Albuterol nebulizer treatment given here and  patient had significant improvement in his symptoms; wheezing resolved.  Treating with prednisone and continued use of albuterol inhaler and nebulizer.  Instructed patient to follow-up with his PCP if his symptoms or not improving.  ED precautions discussed.  Patient agrees to plan of care. ? ? ?Final Clinical Impressions(s) / UC Diagnoses  ? ?Final diagnoses:  ?  Asthma with acute exacerbation, unspecified asthma severity, unspecified whether persistent  ?Productive cough  ?Shortness of breath  ? ? ? ?Discharge Instructions   ? ?  ?Your chest xray is normal.   ? ?Take the prednisone as directed.  Continue using the albuterol inhaler and nebulizer as directed.   ? ?Follow up with your primary care provider if your symptoms are not improving.   ? ?Go to the emergency department if you have shortness of breath or other concerning symptoms.   ? ? ? ? ? ? ?ED Prescriptions   ? ? Medication Sig Dispense Auth. Provider  ? predniSONE (DELTASONE) 10 MG tablet Take 4 tablets (40 mg total) by mouth daily for 5 days. 20 tablet Mickie Bail, NP  ? ?  ? ?PDMP not reviewed this encounter. ?  ?Mickie Bail, NP ?11/19/21 1058 ? ?

## 2021-11-19 NOTE — ED Triage Notes (Signed)
Pt here with 4 day hx of cough, chest congestion, sore throat, and nasal congestion. Pt has asthma and states that the last few nights, it has been hard to sleep despite doing neb treatments and rescue inhaler.  ?

## 2021-11-20 LAB — NOVEL CORONAVIRUS, NAA: SARS-CoV-2, NAA: NOT DETECTED

## 2022-12-26 DIAGNOSIS — H0259 Other disorders affecting eyelid function: Secondary | ICD-10-CM | POA: Diagnosis not present

## 2022-12-31 DIAGNOSIS — J45901 Unspecified asthma with (acute) exacerbation: Secondary | ICD-10-CM | POA: Diagnosis not present

## 2022-12-31 DIAGNOSIS — J208 Acute bronchitis due to other specified organisms: Secondary | ICD-10-CM | POA: Diagnosis not present

## 2023-01-20 ENCOUNTER — Other Ambulatory Visit: Payer: Self-pay | Admitting: Nurse Practitioner

## 2023-01-20 ENCOUNTER — Telehealth: Payer: BC Managed Care – PPO | Admitting: Nurse Practitioner

## 2023-01-20 DIAGNOSIS — J4541 Moderate persistent asthma with (acute) exacerbation: Secondary | ICD-10-CM

## 2023-01-20 MED ORDER — PREDNISONE 10 MG PO TABS: ORAL_TABLET | ORAL | 0 refills | Status: AC

## 2023-01-20 MED ORDER — MONTELUKAST SODIUM 10 MG PO TABS: 10.0000 mg | ORAL_TABLET | Freq: Every day | ORAL | 0 refills | Status: AC

## 2023-01-20 MED ORDER — ALBUTEROL SULFATE HFA 108 (90 BASE) MCG/ACT IN AERS: 2.0000 | INHALATION_SPRAY | RESPIRATORY_TRACT | 0 refills | Status: AC | PRN

## 2023-01-20 NOTE — Progress Notes (Signed)
Virtual Visit Consent   Luis Wallace, you are scheduled for a virtual visit with a [facility] provider today. Just as with appointments in the office, your consent must be obtained to participate. Your consent will be active for this visit and any virtual visit you may have with one of our providers in the next 365 days. If you have a MyChart account, a copy of this consent can be sent to you electronically.  As this is a virtual visit, video technology does not allow for your provider to perform a traditional examination. This may limit your provider's ability to fully assess your condition. If your provider identifies any concerns that need to be evaluated in person or the need to arrange testing (such as labs, EKG, etc.), we will make arrangements to do so. Although advances in technology are sophisticated, we cannot ensure that it will always work on either your end or our end. If the connection with a video visit is poor, the visit may have to be switched to a telephone visit. With either a video or telephone visit, we are not always able to ensure that we have a secure connection.  By engaging in this virtual visit, you consent to the provision of healthcare and authorize for your insurance to be billed (if applicable) for the services provided during this visit. Depending on your insurance coverage, you may receive a charge related to this service.  I need to obtain your verbal consent now. Are you willing to proceed with your visit today? Luis Wallace has provided verbal consent on 01/20/2023 for a virtual visit (video or telephone). [name], NP  Date: 01/20/2023 10:45 AM  Virtual Visit via Video Note   I, [name], connected with  Luis Wallace  ([id number], 10-Nov-1975) on 01/20/23 at 10:45 AM EDT by a video-enabled telemedicine application and verified that I am speaking with the correct person using two identifiers.  Location: Patient: Virtual Visit  Location Patient: Home Provider: Virtual Visit Location Provider: Home Office   I discussed the limitations of evaluation and management by telemedicine and the availability of in person appointments. The patient expressed understanding and agreed to proceed.    History of Present Illness: Luis Wallace is a 47 y.o. who identifies as a male who was assigned male at birth, and is being seen today for asthma exacerbation. .  Asthma Exacerbation: He has previously been evaluated here for asthma and now presents with an asthma exacerbation. This exacerbation began a few days ago.  Associated symptoms include dyspnea, nonproductive cough, productive cough, tooth pain  , and wheezing.  Suspected precipitants include pollens and heat/change in temperature .  Symptoms have been gradually worsening since their onset.  This is the first evaluation that has occurred during this exacerbation. The previous exacerbation occurred 1 year ago. He has treated this current exacerbation with short-acting inhaled beta-adrenergic agonists. The patient reports adherence to this regimen He is not a smoker  Problems: There are no problems to display for this patient.   Allergies:  Allergies  Allergen Reactions   Cefaclor Rash and Hives   Other    Sulfa Antibiotics Swelling   Augmentin [Amoxicillin-Pot Clavulanate] Rash   Penicillins Rash   Medications:  Current Outpatient Medications:    predniSONE (DELTASONE) 10 MG tablet, Directions for 6 day taper: Day 1: 2 tablets before breakfast, 1 after both lunch & dinner and 2 at bedtime Day 2: 1 tab before breakfast, 1 after both  lunch & dinner and 2 at bedtime Day 3: 1 tab at each meal & 1 at bedtime Day 4: 1 tab at breakfast, 1 at lunch, 1 at bedtime Day 5: 1 tab at breakfast & 1 tab at bedtime Day 6: 1 tab at breakfast, Disp: 21 tablet, Rfl: 0   albuterol (VENTOLIN HFA) 108 (90 Base) MCG/ACT inhaler, Inhale 2 puffs into the lungs every 4 (four) hours as needed.,  Disp: 18 g, Rfl: 0   buPROPion (WELLBUTRIN XL) 150 MG 24 hr tablet, Take 150 mg by mouth daily., Disp: , Rfl:    metoprolol succinate (TOPROL-XL) 100 MG 24 hr tablet, Take 100 mg by mouth daily. Take with or immediately following a meal., Disp: , Rfl:    montelukast (SINGULAIR) 10 MG tablet, Take 1 tablet (10 mg total) by mouth at bedtime., Disp: 30 tablet, Rfl: 0   quinapril-hydrochlorothiazide (ACCURETIC) 10-12.5 MG per tablet, Take 1 tablet by mouth daily., Disp: , Rfl:    sertraline (ZOLOFT) 50 MG tablet, Take 50 mg by mouth daily., Disp: , Rfl:   Observations/Objective: Patient is well-developed, well-nourished in no acute distress.  Resting comfortably  at home.  Head is normocephalic, atraumatic.  No labored breathing.  Speech is clear and coherent with logical content.  Patient is alert and oriented at baseline.    Assessment and Plan: 1. Moderate persistent asthma with acute exacerbation - predniSONE (DELTASONE) 10 MG tablet; Directions for 6 day taper: Day 1: 2 tablets before breakfast, 1 after both lunch & dinner and 2 at bedtime Day 2: 1 tab before breakfast, 1 after both lunch & dinner and 2 at bedtime Day 3: 1 tab at each meal & 1 at bedtime Day 4: 1 tab at breakfast, 1 at lunch, 1 at bedtime Day 5: 1 tab at breakfast & 1 tab at bedtime Day 6: 1 tab at breakfast  Dispense: 21 tablet; Refill: 0 - montelukast (SINGULAIR) 10 MG tablet; Take 1 tablet (10 mg total) by mouth at bedtime.  Dispense: 30 tablet; Refill: 0 - albuterol (VENTOLIN HFA) 108 (90 Base) MCG/ACT inhaler; Inhale 2 puffs into the lungs every 4 (four) hours as needed.  Dispense: 18 g; Refill: 0    Follow Up Instructions: I discussed the assessment and treatment plan with the patient. The patient was provided an opportunity to ask questions and all were answered. The patient agreed with the plan and demonstrated an understanding of the instructions.  A copy of instructions were sent to the patient via MyChart unless  otherwise noted below.    The patient was advised to call back or seek an in-person evaluation if the symptoms worsen or if the condition fails to improve as anticipated.  Time:  I spent 12 minutes with the patient via telehealth technology discussing the above problems/concerns.    [name], NP

## 2023-01-20 NOTE — Patient Instructions (Signed)
Luis Wallace, thank you for joining [name], NP for today's virtual visit.  While this provider is not your primary care provider (PCP), if your PCP is located in our provider database this encounter information will be shared with them immediately following your visit.   A [facility] MyChart account gives you access to today's visit and all your visits, tests, and labs performed at [facility] " click here if you don't have a [facility] MyChart account or go to [email or web address]  Consent: (Patient) Luis Wallace provided verbal consent for this virtual visit at the beginning of the encounter.  Current Medications:  Current Outpatient Medications:    predniSONE (DELTASONE) 10 MG tablet, Directions for 6 day taper: Day 1: 2 tablets before breakfast, 1 after both lunch & dinner and 2 at bedtime Day 2: 1 tab before breakfast, 1 after both lunch & dinner and 2 at bedtime Day 3: 1 tab at each meal & 1 at bedtime Day 4: 1 tab at breakfast, 1 at lunch, 1 at bedtime Day 5: 1 tab at breakfast & 1 tab at bedtime Day 6: 1 tab at breakfast, Disp: 21 tablet, Rfl: 0   albuterol (VENTOLIN HFA) 108 (90 Base) MCG/ACT inhaler, Inhale 2 puffs into the lungs every 4 (four) hours as needed., Disp: 18 g, Rfl: 0   buPROPion (WELLBUTRIN XL) 150 MG 24 hr tablet, Take 150 mg by mouth daily., Disp: , Rfl:    metoprolol succinate (TOPROL-XL) 100 MG 24 hr tablet, Take 100 mg by mouth daily. Take with or immediately following a meal., Disp: , Rfl:    montelukast (SINGULAIR) 10 MG tablet, Take 1 tablet (10 mg total) by mouth at bedtime., Disp: 30 tablet, Rfl: 0   quinapril-hydrochlorothiazide (ACCURETIC) 10-12.5 MG per tablet, Take 1 tablet by mouth daily., Disp: , Rfl:    sertraline (ZOLOFT) 50 MG tablet, Take 50 mg by mouth daily., Disp: , Rfl:    Medications ordered in this encounter:  Meds ordered this encounter  Medications   predniSONE (DELTASONE) 10 MG tablet    Sig:  Directions for 6 day taper: Day 1: 2 tablets before breakfast, 1 after both lunch & dinner and 2 at bedtime Day 2: 1 tab before breakfast, 1 after both lunch & dinner and 2 at bedtime Day 3: 1 tab at each meal & 1 at bedtime Day 4: 1 tab at breakfast, 1 at lunch, 1 at bedtime Day 5: 1 tab at breakfast & 1 tab at bedtime Day 6: 1 tab at breakfast    Dispense:  21 tablet    Refill:  0    Order Specific Question:   Supervising Provider    Answer:   Merrilee Jansky [[id number]]   montelukast (SINGULAIR) 10 MG tablet    Sig: Take 1 tablet (10 mg total) by mouth at bedtime.    Dispense:  30 tablet    Refill:  0    Order Specific Question:   Supervising Provider    Answer:   Loreli Dollar   albuterol (VENTOLIN HFA) 108 (90 Base) MCG/ACT inhaler    Sig: Inhale 2 puffs into the lungs every 4 (four) hours as needed.    Dispense:  18 g    Refill:  0    Order Specific Question:   Supervising Provider    Answer:   Merrilee Jansky [id number]     *If you need refills on other medications prior to your next appointment, please  contact your pharmacy*  Follow-Up: Call back or seek an in-person evaluation if the symptoms worsen or if the condition fails to improve as anticipated.  [facility] [phone]  Other Instructions Asthma action plan in place   If you have been instructed to have an in-person evaluation today at a local Urgent Care facility, please use the link below. It will take you to a list of all of our available [facility], including address, phone number and hours of operation. Please do not delay care.  [facility]  If you or a family member do not have a primary care provider, use the link below to schedule a visit and establish care. When you choose a [facility] primary care physician or advanced practice provider, you gain a long-term partner in health. Find a Primary Care Provider  Learn more about [facility]'s in-office  and virtual care options: [facility] - Get Care Now

## 2023-01-21 NOTE — Telephone Encounter (Signed)
Requested Prescriptions  Refused Prescriptions Disp Refills   montelukast (SINGULAIR) 10 MG tablet [Pharmacy Med Name: MONTELUKAST 10MG  TABLETS] 90 tablet     Sig: TAKE 1 TABLET(10 MG) BY MOUTH AT BEDTIME     Pulmonology:  Leukotriene Inhibitors Failed - 01/20/2023 11:10 AM      Failed - Valid encounter within last 12 months    Recent Outpatient Visits   None

## 2023-01-31 DIAGNOSIS — H0289 Other specified disorders of eyelid: Secondary | ICD-10-CM | POA: Diagnosis not present

## 2023-01-31 DIAGNOSIS — H04123 Dry eye syndrome of bilateral lacrimal glands: Secondary | ICD-10-CM | POA: Diagnosis not present

## 2023-11-04 DIAGNOSIS — M109 Gout, unspecified: Secondary | ICD-10-CM | POA: Diagnosis not present

## 2023-12-13 DIAGNOSIS — R7303 Prediabetes: Secondary | ICD-10-CM | POA: Diagnosis not present

## 2023-12-13 DIAGNOSIS — E782 Mixed hyperlipidemia: Secondary | ICD-10-CM | POA: Diagnosis not present

## 2023-12-13 DIAGNOSIS — Z0000 Encounter for general adult medical examination without abnormal findings: Secondary | ICD-10-CM | POA: Diagnosis not present

## 2023-12-13 DIAGNOSIS — I10 Essential (primary) hypertension: Secondary | ICD-10-CM | POA: Diagnosis not present

## 2023-12-13 DIAGNOSIS — R0602 Shortness of breath: Secondary | ICD-10-CM | POA: Diagnosis not present

## 2024-03-31 DIAGNOSIS — R7303 Prediabetes: Secondary | ICD-10-CM | POA: Diagnosis not present

## 2024-03-31 DIAGNOSIS — G4733 Obstructive sleep apnea (adult) (pediatric): Secondary | ICD-10-CM | POA: Diagnosis not present

## 2024-03-31 DIAGNOSIS — I10 Essential (primary) hypertension: Secondary | ICD-10-CM | POA: Diagnosis not present

## 2024-04-13 DIAGNOSIS — G4733 Obstructive sleep apnea (adult) (pediatric): Secondary | ICD-10-CM | POA: Diagnosis not present

## 2024-05-14 DIAGNOSIS — G4733 Obstructive sleep apnea (adult) (pediatric): Secondary | ICD-10-CM | POA: Diagnosis not present

## 2024-06-25 DIAGNOSIS — R7303 Prediabetes: Secondary | ICD-10-CM | POA: Diagnosis not present

## 2024-06-25 DIAGNOSIS — I10 Essential (primary) hypertension: Secondary | ICD-10-CM | POA: Diagnosis not present

## 2024-06-25 DIAGNOSIS — E782 Mixed hyperlipidemia: Secondary | ICD-10-CM | POA: Diagnosis not present

## 2024-06-25 DIAGNOSIS — J4520 Mild intermittent asthma, uncomplicated: Secondary | ICD-10-CM | POA: Diagnosis not present

## 2024-07-30 DIAGNOSIS — I10 Essential (primary) hypertension: Secondary | ICD-10-CM | POA: Diagnosis not present
